# Patient Record
Sex: Male | Born: 1955 | Race: White | Hispanic: No | Marital: Single | Smoking: Current every day smoker
Health system: Southern US, Community
[De-identification: ages and names within clinical notes are randomized; demographics above are authoritative.]

## PROBLEM LIST (undated history)

## (undated) DIAGNOSIS — I10 Essential (primary) hypertension: Secondary | ICD-10-CM

## (undated) DIAGNOSIS — E785 Hyperlipidemia, unspecified: Secondary | ICD-10-CM

## (undated) DIAGNOSIS — E119 Type 2 diabetes mellitus without complications: Secondary | ICD-10-CM

## (undated) HISTORY — DX: Essential (primary) hypertension: I10

## (undated) HISTORY — DX: Hyperlipidemia, unspecified: E78.5

## (undated) HISTORY — DX: Type 2 diabetes mellitus without complications: E11.9

---

## 2013-07-29 ENCOUNTER — Emergency Department: Payer: Self-pay | Admitting: Emergency Medicine

## 2014-07-24 ENCOUNTER — Ambulatory Visit: Payer: Self-pay | Admitting: Nurse Practitioner

## 2014-08-02 LAB — HM DIABETES EYE EXAM

## 2014-09-05 ENCOUNTER — Other Ambulatory Visit: Payer: Self-pay | Admitting: Internal Medicine

## 2014-09-05 ENCOUNTER — Ambulatory Visit
Admission: RE | Admit: 2014-09-05 | Discharge: 2014-09-05 | Disposition: A | Discharge status: Home / Self Care | Payer: Self-pay | Source: Ambulatory Visit | Attending: Internal Medicine | Admitting: Internal Medicine

## 2014-09-05 DIAGNOSIS — M25552 Pain in left hip: Secondary | ICD-10-CM | POA: Insufficient documentation

## 2014-09-05 DIAGNOSIS — M25551 Pain in right hip: Secondary | ICD-10-CM

## 2014-09-26 ENCOUNTER — Other Ambulatory Visit: Payer: Self-pay

## 2014-09-27 ENCOUNTER — Ambulatory Visit: Payer: Self-pay | Admitting: Specialist

## 2014-09-29 ENCOUNTER — Other Ambulatory Visit: Payer: Self-pay | Admitting: Specialist

## 2014-10-02 ENCOUNTER — Other Ambulatory Visit: Payer: Self-pay | Admitting: Specialist

## 2014-10-02 DIAGNOSIS — M48061 Spinal stenosis, lumbar region without neurogenic claudication: Secondary | ICD-10-CM

## 2014-10-03 ENCOUNTER — Ambulatory Visit: Payer: Self-pay

## 2014-10-04 ENCOUNTER — Ambulatory Visit: Payer: Self-pay | Admitting: Internal Medicine

## 2014-10-04 ENCOUNTER — Ambulatory Visit: Payer: Self-pay | Admitting: Ophthalmology

## 2014-10-04 LAB — HM DIABETES EYE EXAM

## 2014-10-09 ENCOUNTER — Ambulatory Visit
Admission: RE | Admit: 2014-10-09 | Discharge: 2014-10-09 | Disposition: A | Discharge status: Home / Self Care | Payer: Self-pay | Source: Ambulatory Visit | Attending: Specialist | Admitting: Specialist

## 2014-10-09 DIAGNOSIS — M4806 Spinal stenosis, lumbar region: Secondary | ICD-10-CM | POA: Insufficient documentation

## 2014-10-09 DIAGNOSIS — Q7649 Other congenital malformations of spine, not associated with scoliosis: Secondary | ICD-10-CM | POA: Insufficient documentation

## 2014-10-09 DIAGNOSIS — M5386 Other specified dorsopathies, lumbar region: Secondary | ICD-10-CM | POA: Insufficient documentation

## 2014-10-09 DIAGNOSIS — M48061 Spinal stenosis, lumbar region without neurogenic claudication: Secondary | ICD-10-CM

## 2014-10-09 DIAGNOSIS — M4606 Spinal enthesopathy, lumbar region: Secondary | ICD-10-CM | POA: Insufficient documentation

## 2014-10-18 ENCOUNTER — Ambulatory Visit: Payer: Self-pay | Admitting: Specialist

## 2014-11-02 ENCOUNTER — Ambulatory Visit: Payer: Self-pay

## 2014-11-15 ENCOUNTER — Ambulatory Visit: Payer: Self-pay | Admitting: Internal Medicine

## 2014-12-13 ENCOUNTER — Ambulatory Visit: Payer: Self-pay | Admitting: Internal Medicine

## 2014-12-13 LAB — HEMOGLOBIN A1C: HEMOGLOBIN A1C: 7.1 % — AB (ref 4.0–6.0)

## 2014-12-13 LAB — BASIC METABOLIC PANEL: Glucose: 132 mg/dL

## 2014-12-16 DIAGNOSIS — E119 Type 2 diabetes mellitus without complications: Secondary | ICD-10-CM

## 2015-03-07 ENCOUNTER — Ambulatory Visit: Payer: Self-pay | Admitting: Specialist

## 2015-03-13 ENCOUNTER — Other Ambulatory Visit: Payer: Self-pay

## 2015-03-20 ENCOUNTER — Ambulatory Visit: Payer: Self-pay

## 2015-04-04 ENCOUNTER — Other Ambulatory Visit: Payer: Self-pay

## 2015-04-11 ENCOUNTER — Ambulatory Visit: Payer: Self-pay | Admitting: Internal Medicine

## 2015-04-19 ENCOUNTER — Ambulatory Visit: Payer: Self-pay

## 2015-04-19 DIAGNOSIS — E785 Hyperlipidemia, unspecified: Secondary | ICD-10-CM | POA: Insufficient documentation
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
## 2015-05-24 ENCOUNTER — Other Ambulatory Visit: Payer: Self-pay

## 2015-05-24 LAB — HEPATIC FUNCTION PANEL
ALT: 21 U/L (ref 10–40)
AST: 18 U/L (ref 14–40)
Alkaline Phosphatase: 86 U/L (ref 25–125)
BILIRUBIN DIRECT: 0.35 mg/dL (ref 0.01–0.4)
Bilirubin, Total: 1.3 mg/dL

## 2015-06-19 DIAGNOSIS — E785 Hyperlipidemia, unspecified: Secondary | ICD-10-CM

## 2015-06-28 ENCOUNTER — Ambulatory Visit: Payer: Self-pay | Admitting: Urology

## 2015-06-28 VITALS — BP 142/82 | HR 73 | Temp 98.1°F | Wt 192.0 lb

## 2015-06-28 DIAGNOSIS — J018 Other acute sinusitis: Secondary | ICD-10-CM

## 2015-06-28 MED ORDER — NAPROXEN 500 MG PO TABS: 500.0000 mg | ORAL_TABLET | Freq: Two times a day (BID) | ORAL | Status: DC

## 2015-06-28 MED ORDER — AMOXICILLIN-POT CLAVULANATE 500-125 MG PO TABS: 1.0000 | ORAL_TABLET | Freq: Three times a day (TID) | ORAL | Status: DC

## 2015-06-28 NOTE — Progress Notes (Signed)
  Patient: Jorge Olson Male    DOB: 03/14/1956   60 y.o.   MRN: [id number] Visit Date: 06/28/2015  Today's Provider: [facility]   Chief Complaint  Patient presents with  . Sinusitis  . Sore Throat   Subjective:    HPI   Patient states that last Thursday he started with muscle aches, sore throat, congestion, rhinitis, and headaches.  He believes he had a fever and had to sleep with an electric blanket to "sweat" the fever out.  He has tried everything over the counter, such as Mucinex, Sudafed, robitussin, Theraflu, etc.  No relief.  He complains of green and yellow drainage from his nose.     No Known Allergies Previous Medications   ATORVASTATIN (LIPITOR) 20 MG TABLET    Take 20 mg by mouth daily.   FLUOXETINE (PROZAC) 20 MG CAPSULE    Take 20 mg by mouth daily. Reported on 06/28/2015   GABAPENTIN (NEURONTIN) 300 MG CAPSULE    Take 300 mg by mouth at bedtime. 1 capsule QHS x  3 days then 1 capsule 2 times daily x 3 days, then 1 capsule TID for neuropathy and RLS.   INSULIN DETEMIR (LEVEMIR) 100 UNIT/ML INJECTION    Inject 15 Units into the skin 2 (two) times daily.   METFORMIN (GLUCOPHAGE) 1000 MG TABLET    Take 1,000 mg by mouth 2 (two) times daily with a meal.   NAPROXEN (NAPROSYN) 500 MG TABLET    Take 500 mg by mouth 2 (two) times daily with a meal.   SIMVASTATIN (ZOCOR) 20 MG TABLET    Take 20 mg by mouth daily.    Review of Systems  Social History  Substance Use Topics  . Smoking status: Current Every Day Smoker -- 1.00 packs/day    Types: Cigarettes  . Smokeless tobacco: Not on file     Comment: Smokes less than a pack a day.  . Alcohol Use: No   Objective:   BP 142/82 mmHg  Pulse 73  Temp(Src) 98.1 F (36.7 C) (Oral)  Wt 192 lb (87.091 kg)  SpO2 97%  Physical Exam Heart: S1, S2, without murmurs, rubs or gallops  Lung:  CTAB     Assessment & Plan:     1. Sinusitis  Augmentin 500/125, three times daily  RTC on appointment      [facility]  [facility]

## 2015-07-17 ENCOUNTER — Ambulatory Visit: Payer: Self-pay | Admitting: Physician Assistant

## 2015-07-17 VITALS — BP 129/74 | HR 88 | Temp 98.6°F | Wt 198.0 lb

## 2015-07-17 DIAGNOSIS — Z794 Long term (current) use of insulin: Principal | ICD-10-CM

## 2015-07-17 DIAGNOSIS — E119 Type 2 diabetes mellitus without complications: Secondary | ICD-10-CM

## 2015-07-17 LAB — POCT GLYCOSYLATED HEMOGLOBIN (HGB A1C): HEMOGLOBIN A1C: 8.2

## 2015-07-17 LAB — GLUCOSE, POCT (MANUAL RESULT ENTRY): POC GLUCOSE: 162 mg/dL — AB (ref 70–99)

## 2015-07-17 MED ORDER — LIRAGLUTIDE 18 MG/3ML ~~LOC~~ SOPN: PEN_INJECTOR | SUBCUTANEOUS | Status: DC

## 2015-07-17 NOTE — Progress Notes (Unsigned)
Subjective:     Patient ID: Jorge Olson, male   DOB: 03/16/1956, 60 y.o.   MRN: [id number]  HPI You were diagnosed 2003, Type II. Started insulin last July. A1c is down from 9.7 to 8.2. Goal range is 6.5.    Denies hypoglycemic. Checks blood sugar twice a day before and after work (10pm). Denies changes in diet but wants to consider raising insulin to get in goal range for A1c. Back pain prohibits exercise.   Wants to talk to endo about bilateral sharp-shooting pain in neck. Started 3 months ago, denies anything that brings it on or makes it better. Tylenol extra strength x4 arthritis every morning, and 2x at night seems to help. Counseled that this was not good. Order liver enzyme test.  Denies ever consuming alcohol.   Reported average of 150-160.   Had the "flu" couple of weeks ago. Was prescribed amoxicillin. Never vomitted, had diarrhea. Fever. Hacking cough and chest pain. More like he had PNA and was given augmentin for the PNA that caused the diarrhea.   Wants to be restarted on Zoloft. Reported 150mg  daily but having to discontinue due to loss of insurance.     Sharpshh       Review of Systems     Objective:   Physical Exam CV: normal s1/s2. RRR.Marland Kitchen. No m/r/g Pulm: Bilateral wheezing Neuro: Monofilament normal     Assessment:     ***    Plan:     ***

## 2015-07-17 NOTE — Progress Notes (Signed)
Assessment:   1. Type 2 diabetes mellitus without complication, with long-term current use of insulin (HCC)  - POCT glucose (manual entry) - POCT glycosylated hemoglobin (Hb A1C)      Plan:    1.  Rx changes: Consider adding GLP1-RA next visit. He is attempting [facility] pending this he may go there for DM care.  2.  Education: Reviewed diabetes management:  Appropriate A1C target, avoid hypoglycemia,  blood pressure (<140/80), and cholesterol (LDL <100). -   Reviewed importance of good glycemic control to reduce risk for complications.   3.   Get regular physical activity at least 30 minutes a day of moderate intensity most days of the week  4. Follow up: I recommend patient follow-up with me at 3 months.        Subjective:    Jorge Olson is a 60 y.o. male who is seen in follow up forType 2 diabetes for Group Shared Appointment. He consents and participates.  Eye exam current (within one year): here at [facility] 2016   SMBG values were reviewed with the patient.  Jorge Olson is performing SMBG on average 2 times per day.   Average over last 7 days is 150s FPg max > 250 postprandial   Reports no symptoms of hypoglycemia.   Denies foot lesions or ulcers. Does have paraesthesias and foot pain.  Denies severe hypoglycemia or admission to hospital for DKA.  Current exercise: little  The patient's history was reviewed and updated as appropriate.  No Known Allergies   Current outpatient prescriptions:  .  aspirin 81 MG tablet, Take 81 mg by mouth daily., Disp: , Rfl:  .  atorvastatin (LIPITOR) 20 MG tablet, Take 20 mg by mouth daily., Disp: , Rfl:  .  gabapentin (NEURONTIN) 300 MG capsule, Take 300 mg by mouth at bedtime. 3 capsules at night and 3 capsules in the morning, Disp: , Rfl:  .  insulin detemir (LEVEMIR) 100 UNIT/ML injection, Inject 20 Units into the skin 2 (two) times daily. , Disp: , Rfl:  .  metFORMIN (GLUCOPHAGE) 1000 MG tablet, Take  1,000 mg by mouth 2 (two) times daily with a meal., Disp: , Rfl:  .  naproxen (NAPROSYN) 500 MG tablet, Take 1 tablet (500 mg total) by mouth 2 (two) times daily with a meal., Disp: 60 tablet, Rfl: 3 .  amoxicillin-clavulanate (AUGMENTIN) 500-125 MG tablet, Take 1 tablet (500 mg total) by mouth 3 (three) times daily. (Patient not taking: Reported on 07/17/2015), Disp: 30 tablet, Rfl: 0 .  FLUoxetine (PROZAC) 20 MG capsule, Take 20 mg by mouth daily. Reported on 07/17/2015, Disp: , Rfl:  .  simvastatin (ZOCOR) 20 MG tablet, Take 20 mg by mouth daily. Reported on 07/17/2015, Disp: , Rfl:   Social History   Social History  . Marital Status: Single    Spouse Name: N/A  . Number of Children: N/A  . Years of Education: N/A   Social History Main Topics  . Smoking status: Current Every Day Smoker -- 1.00 packs/day    Types: Cigarettes  . Smokeless tobacco: Not on file     Comment: Smokes less than a pack a day.  . Alcohol Use: No  . Drug Use: Not on file  . Sexual Activity: Not on file   Other Topics Concern  . Not on file   Social History Narrative    Family History  Problem Relation Age of Onset  . Diabetes Mellitus II Mother   .  Diabetes Mellitus II Father   . Diabetes Mellitus II Sister   . Diabetes Mellitus II Sister     Review of Systems A 12 point review of systems was negative except for pertinent items noted in the HPI.   Objective:     Wt Readings from Last 3 Encounters:  07/17/15 198 lb (89.812 kg)  06/28/15 192 lb (87.091 kg)  04/19/15 181 lb (82.101 kg)   BP 129/74 mmHg  Pulse 88  Temp(Src) 98.6 F (37 C)  Wt 198 lb (89.812 kg)                                            Lab Review No components found for: A1C No results found for: GLUCOSE, POTASSIUM, CHLORIDE, CO2, BUN, CREATININE No components found for: LDL,  LDLCALC,  LDLDIRECT Lab Results  Component Value Date   GLU 132 12/13/2014     DIABETES MELLITUS RESULTS: Lab Results   Component Value Date   HGBA1C 8.2 07/17/2015   HGBA1C 7.1* 12/13/2014   No results found for: GLUF, MICROALBUR, LDLCALC, CREATININE No results found for: CHOL No results found for: LDLCALC No components found for: CHOLLLDLDIRECT No components found for: MICROALB/CR No results found for: Pilar JarvisGFRAA, [facility] HospitalGFRNONAA

## 2015-07-19 ENCOUNTER — Other Ambulatory Visit: Payer: Self-pay

## 2015-07-19 DIAGNOSIS — E7800 Pure hypercholesterolemia, unspecified: Secondary | ICD-10-CM

## 2015-07-19 DIAGNOSIS — E119 Type 2 diabetes mellitus without complications: Secondary | ICD-10-CM

## 2015-07-20 LAB — LIPID PANEL
CHOLESTEROL TOTAL: 86 mg/dL — AB (ref 100–199)
Chol/HDL Ratio: 2 ratio units (ref 0.0–5.0)
HDL: 44 mg/dL (ref >39)
Triglycerides: 226 mg/dL — ABNORMAL HIGH (ref 0–149)
VLDL CHOLESTEROL CAL: 45 mg/dL — AB (ref 5–40)

## 2015-07-20 LAB — HEPATIC FUNCTION PANEL
ALBUMIN: 4.5 g/dL (ref 3.5–5.5)
ALT: 24 IU/L (ref 0–44)
AST: 18 IU/L (ref 0–40)
Alkaline Phosphatase: 93 IU/L (ref 39–117)
Bilirubin Total: 0.9 mg/dL (ref 0.0–1.2)
Bilirubin, Direct: 0.25 mg/dL (ref 0.00–0.40)
TOTAL PROTEIN: 6.2 g/dL (ref 6.0–8.5)

## 2015-07-20 LAB — HEMOGLOBIN A1C
ESTIMATED AVERAGE GLUCOSE: 180 mg/dL
Hgb A1c MFr Bld: 7.9 % — ABNORMAL HIGH (ref 4.8–5.6)

## 2015-07-31 ENCOUNTER — Ambulatory Visit: Payer: Self-pay | Admitting: Nurse Practitioner

## 2015-07-31 VITALS — BP 142/88 | HR 67 | Wt 199.0 lb

## 2015-07-31 DIAGNOSIS — Z794 Long term (current) use of insulin: Secondary | ICD-10-CM

## 2015-07-31 DIAGNOSIS — E1142 Type 2 diabetes mellitus with diabetic polyneuropathy: Secondary | ICD-10-CM

## 2015-07-31 DIAGNOSIS — J0100 Acute maxillary sinusitis, unspecified: Secondary | ICD-10-CM

## 2015-07-31 DIAGNOSIS — I10 Essential (primary) hypertension: Secondary | ICD-10-CM

## 2015-07-31 DIAGNOSIS — J302 Other seasonal allergic rhinitis: Secondary | ICD-10-CM

## 2015-07-31 DIAGNOSIS — E1165 Type 2 diabetes mellitus with hyperglycemia: Secondary | ICD-10-CM

## 2015-07-31 DIAGNOSIS — IMO0002 Reserved for concepts with insufficient information to code with codable children: Secondary | ICD-10-CM

## 2015-07-31 DIAGNOSIS — E782 Mixed hyperlipidemia: Secondary | ICD-10-CM

## 2015-07-31 MED ORDER — FEXOFENADINE HCL 180 MG PO TABS: 180.0000 mg | ORAL_TABLET | Freq: Every day | ORAL | Status: DC

## 2015-07-31 MED ORDER — SIMVASTATIN 20 MG PO TABS: 20.0000 mg | ORAL_TABLET | Freq: Every day | ORAL | Status: DC

## 2015-07-31 MED ORDER — GABAPENTIN 300 MG PO CAPS: 300.0000 mg | ORAL_CAPSULE | Freq: Every day | ORAL | Status: DC

## 2015-07-31 MED ORDER — CEFDINIR 300 MG PO CAPS: 300.0000 mg | ORAL_CAPSULE | Freq: Two times a day (BID) | ORAL | Status: DC

## 2015-07-31 NOTE — Progress Notes (Signed)
   Subjective:    Patient ID: Jorge Olson, male    DOB: 11/01/1955, 60 y.o.   MRN: [id number]  Foot Pain Associated symptoms include congestion and coughing.  Diabetes   Pt with c/o burning of feet, interfering with sleep, is taking gabapentin 900 mg bid, med does help but is out of gabapentin.   Sleeps with electric blanket for legs and feet.     C/o allergic rhinitis, with some mucopurulent drainage.  Was recently treated for sinusitis, did not complete script, r/t diarrhea.    Reports fever x 1.       Review of Systems  HENT: Positive for congestion, postnasal drip, rhinorrhea, sinus pressure and sneezing.   Respiratory: Positive for cough.    Positive for severe burning of feet.    No other c/o     Objective:   Physical Exam  Constitutional: He appears well-developed and well-nourished.  HENT:  Head: Normocephalic.  Eyes: Pupils are equal, round, and reactive to light.  Neck: No thyromegaly present.  Cardiovascular: Normal rate, normal heart sounds and intact distal pulses.   Pulmonary/Chest: No respiratory distress. He has no wheezes.  Musculoskeletal: He exhibits no edema.  Lymphadenopathy:    He has no cervical adenopathy.  Psychiatric: He has a normal mood and affect. Judgment and thought content normal.   L foot with mildly decreased sensation, no alteration in skin integrity.    Positive for facial tenderness to percussion       Assessment & Plan:  Jorge Olson was seen today for foot pain and diabetes.  Diagnoses and all orders for this visit:  Diabetic polyneuropathy associated with type 2 diabetes mellitus (HCC)  Other seasonal allergic rhinitis  Acute maxillary sinusitis, recurrence not specified  Other orders -     gabapentin (NEURONTIN) 300 MG capsule; Take 1 capsule (300 mg total) by mouth at bedtime. 3 capsules at night and 3 capsules in the morning -     simvastatin (ZOCOR) 20 MG tablet; Take 1 tablet (20 mg total) by mouth daily. Reported  on 07/31/2015 -     fexofenadine (ALLEGRA) 180 MG tablet; Take 1 tablet (180 mg total) by mouth daily. -     cefdinir (OMNICEF) 300 MG capsule; Take 1 capsule (300 mg total) by mouth 2 (two) times daily.  will order labs to be drawn in 3 months, with follow up scheduled after lab results are available

## 2015-08-30 ENCOUNTER — Other Ambulatory Visit: Payer: Self-pay | Admitting: Internal Medicine

## 2015-09-05 ENCOUNTER — Encounter: Payer: Self-pay | Admitting: Nurse Practitioner

## 2015-09-11 ENCOUNTER — Ambulatory Visit: Payer: Self-pay

## 2015-09-11 DIAGNOSIS — E782 Mixed hyperlipidemia: Secondary | ICD-10-CM

## 2015-09-11 DIAGNOSIS — I10 Essential (primary) hypertension: Secondary | ICD-10-CM

## 2015-09-11 DIAGNOSIS — E1142 Type 2 diabetes mellitus with diabetic polyneuropathy: Secondary | ICD-10-CM

## 2015-09-11 DIAGNOSIS — E1165 Type 2 diabetes mellitus with hyperglycemia: Secondary | ICD-10-CM

## 2015-09-11 DIAGNOSIS — Z794 Long term (current) use of insulin: Secondary | ICD-10-CM

## 2015-09-11 DIAGNOSIS — IMO0002 Reserved for concepts with insufficient information to code with codable children: Secondary | ICD-10-CM

## 2015-09-12 LAB — COMPREHENSIVE METABOLIC PANEL
A/G RATIO: 1 — AB (ref 1.2–2.2)
ALT: 21 IU/L (ref 0–44)
AST: 18 IU/L (ref 0–40)
Albumin: 3.9 g/dL (ref 3.5–5.5)
Alkaline Phosphatase: 99 IU/L (ref 39–117)
BUN/Creatinine Ratio: 17 (ref 9–20)
BUN: 11 mg/dL (ref 6–24)
Bilirubin Total: 0.3 mg/dL (ref 0.0–1.2)
CALCIUM: 9.4 mg/dL (ref 8.7–10.2)
CO2: 23 mmol/L (ref 18–29)
Chloride: 99 mmol/L (ref 96–106)
Creatinine, Ser: 0.65 mg/dL — ABNORMAL LOW (ref 0.76–1.27)
GFR, EST AFRICAN AMERICAN: 125 mL/min/{1.73_m2} (ref >59)
GFR, EST NON AFRICAN AMERICAN: 108 mL/min/{1.73_m2} (ref >59)
Globulin, Total: 3.8 g/dL (ref 1.5–4.5)
Glucose: 92 mg/dL (ref 65–99)
POTASSIUM: 4.1 mmol/L (ref 3.5–5.2)
Sodium: 139 mmol/L (ref 134–144)
TOTAL PROTEIN: 7.7 g/dL (ref 6.0–8.5)

## 2015-09-12 LAB — URINALYSIS
Bilirubin, UA: NEGATIVE
Ketones, UA: NEGATIVE
Leukocytes, UA: NEGATIVE
NITRITE UA: NEGATIVE
Protein, UA: NEGATIVE
RBC, UA: NEGATIVE
Specific Gravity, UA: >=1.03 — AB (ref 1.005–1.030)
UUROB: 0.2 mg/dL (ref 0.2–1.0)
pH, UA: 5.5 (ref 5.0–7.5)

## 2015-09-17 ENCOUNTER — Telehealth: Payer: Self-pay | Admitting: Urology

## 2015-09-17 NOTE — Telephone Encounter (Signed)
Patient will need an urine microalbumin w/creat ratio in the future.

## 2015-09-18 ENCOUNTER — Other Ambulatory Visit: Payer: Self-pay

## 2015-09-20 ENCOUNTER — Other Ambulatory Visit: Payer: Self-pay

## 2015-09-20 DIAGNOSIS — E119 Type 2 diabetes mellitus without complications: Secondary | ICD-10-CM

## 2015-09-20 DIAGNOSIS — Z794 Long term (current) use of insulin: Principal | ICD-10-CM

## 2015-09-21 LAB — MICROALBUMIN / CREATININE URINE RATIO
CREATININE, UR: 206.6 mg/dL
MICROALB/CREAT RATIO: 5.9 mg/g creat (ref 0.0–30.0)
Microalbumin, Urine: 12.2 ug/mL

## 2015-09-21 LAB — LIPID PANEL
CHOL/HDL RATIO: 1.6 ratio (ref 0.0–5.0)
Cholesterol, Total: 69 mg/dL — ABNORMAL LOW (ref 100–199)
HDL: 44 mg/dL (ref >39)
Triglycerides: 128 mg/dL (ref 0–149)
VLDL Cholesterol Cal: 26 mg/dL (ref 5–40)

## 2015-10-16 ENCOUNTER — Ambulatory Visit: Payer: Self-pay | Admitting: Physician Assistant

## 2015-10-16 DIAGNOSIS — E1142 Type 2 diabetes mellitus with diabetic polyneuropathy: Secondary | ICD-10-CM

## 2015-10-16 DIAGNOSIS — E785 Hyperlipidemia, unspecified: Secondary | ICD-10-CM

## 2015-10-16 DIAGNOSIS — Z794 Long term (current) use of insulin: Principal | ICD-10-CM

## 2015-10-16 MED ORDER — GABAPENTIN 300 MG PO CAPS: 300.0000 mg | ORAL_CAPSULE | Freq: Every day | ORAL | Status: DC

## 2015-10-16 MED ORDER — SIMVASTATIN 20 MG PO TABS: 20.0000 mg | ORAL_TABLET | Freq: Every day | ORAL | Status: DC

## 2015-10-16 MED ORDER — NAPROXEN 500 MG PO TABS: 500.0000 mg | ORAL_TABLET | Freq: Two times a day (BID) | ORAL | Status: DC

## 2015-10-16 MED ORDER — METFORMIN HCL 1000 MG PO TABS
ORAL_TABLET | ORAL | Status: DC
Start: 2015-10-16 — End: 2016-01-24

## 2015-10-16 MED ORDER — FEXOFENADINE HCL 180 MG PO TABS: 180.0000 mg | ORAL_TABLET | Freq: Every day | ORAL | Status: DC

## 2015-10-16 NOTE — Progress Notes (Signed)
Assessment:   1. Type 2 diabetes mellitus with diabetic polyneuropathy, with long-term current use of insulin (HCC)  - gabapentin (NEURONTIN) 300 MG capsule; Take 1 capsule (300 mg total) by mouth at bedtime. 3 capsules at night and 3 capsules in the morning  Dispense: 630 capsule; Refill: 1 - metFORMIN (GLUCOPHAGE) 1000 MG tablet; TAKE 1 TABLET BY MOUTH 2 TIMES A DAY.  Dispense: 180 tablet; Refill: PRN - naproxen (NAPROSYN) 500 MG tablet; Take 1 tablet (500 mg total) by mouth 2 (two) times daily with a meal.  Dispense: 60 tablet; Refill: 3 - fexofenadine (ALLEGRA) 180 MG tablet; Take 1 tablet (180 mg total) by mouth daily.  Dispense: 90 tablet; Refill: 1 - simvastatin (ZOCOR) 20 MG tablet; Take 1 tablet (20 mg total) by mouth daily. Reported on 07/31/2015  Dispense: 90 tablet; Refill: 1  2. Hyperlipidemia  Jorge Olson attended the shared medical appointment  Topics that were discussed tonight include diet, exercise, taking medications, blood glucose pattern recognition, meaning of laboratory results.        Plan:    1.  Rx changes: none  2.  Education: Reviewed diabetes management:  Appropriate A1C target, avoid hypoglycemia,  blood pressure (<140/80), and cholesterol (LDL <100). -   Reviewed importance of good glycemic control to reduce risk for complications.   3.   Get regular physical activity at least 30 minutes a day of moderate intensity most days of the week  This discussion took at least 15 minutes out of a total visit time of 25 minutes today.  4. Follow up: I recommend patient follow-up with me at 3 months.   5. Referrals: none     Subjective:    Jorge Olson is a 60 y.o. male who is seen in follow up forType 2 diabetes from 07/17/2015   Jorge Olson is performing SMBG on average 2 times per day.   Average over last 7 days is 151  Reports no symptoms of hypoglycemia.   The patient's history was reviewed and updated as appropriate.  No Known  Allergies   Current outpatient prescriptions:  .  acetaminophen (TYLENOL) 650 MG CR tablet, Take 650 mg by mouth as needed for pain (3 in the morning and 2 in the evening)., Disp: , Rfl:  .  aspirin 81 MG tablet, Take 81 mg by mouth daily., Disp: , Rfl:  .  fexofenadine (ALLEGRA) 180 MG tablet, Take 1 tablet (180 mg total) by mouth daily., Disp: 90 tablet, Rfl: 1 .  gabapentin (NEURONTIN) 300 MG capsule, Take 1 capsule (300 mg total) by mouth at bedtime. 3 capsules at night and 3 capsules in the morning, Disp: 630 capsule, Rfl: 1 .  insulin detemir (LEVEMIR) 100 UNIT/ML injection, Inject 16 Units into the skin at bedtime. , Disp: , Rfl:  .  Liraglutide 18 MG/3ML SOPN, 0.6 mg sq qd x 1 week then 1.2 mg  Sq qd (Patient taking differently: 1.8  mg sq qd x 1 week then 1.2 mg  Sq qd), Disp: 6 mL, Rfl: 3 .  metFORMIN (GLUCOPHAGE) 1000 MG tablet, TAKE 1 TABLET BY MOUTH 2 TIMES A DAY., Disp: 180 tablet, Rfl: PRN .  naproxen (NAPROSYN) 500 MG tablet, Take 1 tablet (500 mg total) by mouth 2 (two) times daily with a meal., Disp: 60 tablet, Rfl: 3 .  simvastatin (ZOCOR) 20 MG tablet, Take 1 tablet (20 mg total) by mouth daily. Reported on 07/31/2015, Disp: 90 tablet, Rfl: 1 .  FLUoxetine (PROZAC)  20 MG capsule, Take 20 mg by mouth daily. Reported on 10/16/2015, Disp: , Rfl:   Social History   Social History  . Marital Status: Single    Spouse Name: N/A  . Number of Children: N/A  . Years of Education: N/A   Social History Main Topics  . Smoking status: Current Every Day Smoker -- 1.00 packs/day    Types: Cigarettes  . Smokeless tobacco: Not on file     Comment: Smokes less than a pack a day.  . Alcohol Use: No  . Drug Use: No  . Sexual Activity: Not on file   Other Topics Concern  . Not on file   Social History Narrative    Family History  Problem Relation Age of Onset  . Diabetes Mellitus II Mother   . Diabetes Mellitus II Father   . Diabetes Mellitus II Sister   . Diabetes Mellitus II  Sister     Review of Systems A 12 point review of systems was negative except for pertinent items noted in the HPI.   Objective:     Wt Readings from Last 3 Encounters:  07/31/15 199 lb (90.266 kg)  07/17/15 198 lb (89.812 kg)  06/28/15 192 lb (87.091 kg)   There were no vitals taken for this visit.                                            Lab Review No components found for: A1C GLUCOSE (mg/dL)  Date Value  04/54/098105/16/2017 92   CO2 (mmol/L)  Date Value  09/11/2015 23   BUN (mg/dL)  Date Value  19/14/782905/16/2017 11   CREATININE, SER (mg/dL)  Date Value  56/21/308605/16/2017 0.65*   No components found for: LDL,  LDLCALC,  LDLDIRECT Lab Results  Component Value Date   NA 139 09/11/2015   K 4.1 09/11/2015   CL 99 09/11/2015   CO2 23 09/11/2015   BUN 11 09/11/2015   CREATININE 0.65* 09/11/2015   GFRAA 125 09/11/2015   GFRNONAA 108 09/11/2015   GLU 132 12/13/2014   CALCIUM 9.4 09/11/2015   ALBUMIN 3.9 09/11/2015     DIABETES MELLITUS RESULTS: Lab Results  Component Value Date   HGBA1C 7.9* 07/19/2015   HGBA1C 8.2 07/17/2015   HGBA1C 7.1* 12/13/2014   Lab Results  Component Value Date   LDLCALC Comment* 09/20/2015   CREATININE 0.65* 09/11/2015   Lab Results  Component Value Date   CHOL 69* 09/20/2015   Lab Results  Component Value Date   LDLCALC Comment* 09/20/2015   No components found for: CHOLLLDLDIRECT No components found for: MICROALB/CR Lab Results  Component Value Date   GFRAA 125 09/11/2015   GFRNONAA 108 09/11/2015

## 2015-11-06 ENCOUNTER — Other Ambulatory Visit: Payer: Self-pay

## 2015-11-06 DIAGNOSIS — Z794 Long term (current) use of insulin: Secondary | ICD-10-CM

## 2015-11-06 DIAGNOSIS — E1142 Type 2 diabetes mellitus with diabetic polyneuropathy: Secondary | ICD-10-CM

## 2015-11-06 DIAGNOSIS — E785 Hyperlipidemia, unspecified: Secondary | ICD-10-CM

## 2015-11-07 LAB — MICROALBUMIN / CREATININE URINE RATIO
Creatinine, Urine: 248.8 mg/dL
MICROALB/CREAT RATIO: 7.4 mg/g{creat} (ref 0.0–30.0)
MICROALBUM., U, RANDOM: 18.5 ug/mL

## 2015-11-07 LAB — LIPID PANEL
CHOL/HDL RATIO: 1.9 ratio (ref 0.0–5.0)
CHOLESTEROL TOTAL: 86 mg/dL — AB (ref 100–199)
HDL: 45 mg/dL (ref >39)
TRIGLYCERIDES: 214 mg/dL — AB (ref 0–149)
VLDL Cholesterol Cal: 43 mg/dL — ABNORMAL HIGH (ref 5–40)

## 2015-11-07 LAB — HEMOGLOBIN A1C
Est. average glucose Bld gHb Est-mCnc: 157 mg/dL
HEMOGLOBIN A1C: 7.1 % — AB (ref 4.8–5.6)

## 2016-01-09 ENCOUNTER — Other Ambulatory Visit: Payer: Self-pay | Admitting: Internal Medicine

## 2016-01-09 DIAGNOSIS — E1142 Type 2 diabetes mellitus with diabetic polyneuropathy: Secondary | ICD-10-CM

## 2016-01-09 DIAGNOSIS — Z794 Long term (current) use of insulin: Principal | ICD-10-CM

## 2016-01-10 ENCOUNTER — Other Ambulatory Visit: Payer: Self-pay

## 2016-01-10 DIAGNOSIS — Z794 Long term (current) use of insulin: Principal | ICD-10-CM

## 2016-01-10 DIAGNOSIS — E1142 Type 2 diabetes mellitus with diabetic polyneuropathy: Secondary | ICD-10-CM

## 2016-01-10 MED ORDER — GABAPENTIN 300 MG PO CAPS: 300.0000 mg | ORAL_CAPSULE | Freq: Three times a day (TID) | ORAL | 0 refills | Status: DC

## 2016-01-24 ENCOUNTER — Ambulatory Visit: Payer: Self-pay | Admitting: Nurse Practitioner

## 2016-01-24 VITALS — BP 125/75 | HR 71 | Ht 72.0 in | Wt 194.0 lb

## 2016-01-24 DIAGNOSIS — E119 Type 2 diabetes mellitus without complications: Secondary | ICD-10-CM

## 2016-01-24 DIAGNOSIS — E782 Mixed hyperlipidemia: Secondary | ICD-10-CM

## 2016-01-24 DIAGNOSIS — E1142 Type 2 diabetes mellitus with diabetic polyneuropathy: Secondary | ICD-10-CM

## 2016-01-24 DIAGNOSIS — Z794 Long term (current) use of insulin: Secondary | ICD-10-CM

## 2016-01-24 MED ORDER — GABAPENTIN 300 MG PO CAPS: 300.0000 mg | ORAL_CAPSULE | Freq: Three times a day (TID) | ORAL | 0 refills | Status: DC

## 2016-01-24 MED ORDER — METFORMIN HCL 1000 MG PO TABS: ORAL_TABLET | ORAL | 99 refills | Status: DC

## 2016-01-24 MED ORDER — INSULIN DETEMIR 100 UNIT/ML ~~LOC~~ SOLN: 16.0000 [IU] | Freq: Every day | SUBCUTANEOUS | 2 refills | Status: DC

## 2016-01-24 MED ORDER — NORTRIPTYLINE HCL 25 MG PO CAPS: ORAL_CAPSULE | ORAL | 2 refills | Status: DC

## 2016-01-24 MED ORDER — FEXOFENADINE HCL 180 MG PO TABS: 180.0000 mg | ORAL_TABLET | Freq: Every day | ORAL | 1 refills | Status: DC

## 2016-01-24 MED ORDER — NAPROXEN 500 MG PO TABS: 500.0000 mg | ORAL_TABLET | Freq: Two times a day (BID) | ORAL | 3 refills | Status: DC

## 2016-01-24 MED ORDER — SIMVASTATIN 20 MG PO TABS: 20.0000 mg | ORAL_TABLET | Freq: Every day | ORAL | 1 refills | Status: DC

## 2016-01-24 MED ORDER — LIRAGLUTIDE 18 MG/3ML ~~LOC~~ SOPN: 1.8000 mg | PEN_INJECTOR | Freq: Every morning | SUBCUTANEOUS | 11 refills | Status: DC

## 2016-01-24 NOTE — Patient Instructions (Signed)
See written instructions

## 2016-01-24 NOTE — Progress Notes (Signed)
Is requesting starting on zoloft, which is not on our formulary States he has an anger issue Is not taking prozac    Exam:   Alert verbally approp No adenopathy, no thyromegaly, carotid bruit not present BBrS mildly diminished, otherwise unremarkable AP RRR No BLE edema Palpable PT pulses bilaterally  Impression:    Anger, will start nortriptyline 25 mg at hs, x 2 weeks, then 2 at hs  Will have labs drawn in late oct, then see pt after labs to review.  Will also assess his response to nortriptlyine.

## 2016-02-21 ENCOUNTER — Other Ambulatory Visit: Payer: Self-pay

## 2016-02-21 DIAGNOSIS — Z794 Long term (current) use of insulin: Secondary | ICD-10-CM

## 2016-02-21 DIAGNOSIS — E119 Type 2 diabetes mellitus without complications: Secondary | ICD-10-CM

## 2016-02-21 DIAGNOSIS — E782 Mixed hyperlipidemia: Secondary | ICD-10-CM

## 2016-02-21 DIAGNOSIS — Z23 Encounter for immunization: Secondary | ICD-10-CM

## 2016-02-22 LAB — HEMOGLOBIN A1C
ESTIMATED AVERAGE GLUCOSE: 151 mg/dL
HEMOGLOBIN A1C: 6.9 % — AB (ref 4.8–5.6)

## 2016-02-22 LAB — COMPREHENSIVE METABOLIC PANEL
A/G RATIO: 2.8 — AB (ref 1.2–2.2)
ALBUMIN: 5.1 g/dL (ref 3.5–5.5)
ALT: 41 IU/L (ref 0–44)
AST: 30 IU/L (ref 0–40)
Alkaline Phosphatase: 95 IU/L (ref 39–117)
BILIRUBIN TOTAL: 1.1 mg/dL (ref 0.0–1.2)
BUN / CREAT RATIO: 20 (ref 9–20)
BUN: 27 mg/dL — ABNORMAL HIGH (ref 6–24)
CHLORIDE: 99 mmol/L (ref 96–106)
CO2: 26 mmol/L (ref 18–29)
Calcium: 10.5 mg/dL — ABNORMAL HIGH (ref 8.7–10.2)
Creatinine, Ser: 1.33 mg/dL — ABNORMAL HIGH (ref 0.76–1.27)
GFR calc non Af Amer: 59 mL/min/{1.73_m2} — ABNORMAL LOW (ref >59)
GFR, EST AFRICAN AMERICAN: 68 mL/min/{1.73_m2} (ref >59)
GLOBULIN, TOTAL: 1.8 g/dL (ref 1.5–4.5)
Glucose: 122 mg/dL — ABNORMAL HIGH (ref 65–99)
POTASSIUM: 5.1 mmol/L (ref 3.5–5.2)
SODIUM: 145 mmol/L — AB (ref 134–144)
TOTAL PROTEIN: 6.9 g/dL (ref 6.0–8.5)

## 2016-02-22 LAB — LIPID PANEL
CHOL/HDL RATIO: 1.9 ratio (ref 0.0–5.0)
Cholesterol, Total: 93 mg/dL — ABNORMAL LOW (ref 100–199)
HDL: 49 mg/dL (ref >39)
LDL Calculated: 0 mg/dL (ref 0–99)
TRIGLYCERIDES: 219 mg/dL — AB (ref 0–149)
VLDL Cholesterol Cal: 44 mg/dL — ABNORMAL HIGH (ref 5–40)

## 2016-02-28 ENCOUNTER — Ambulatory Visit: Payer: Self-pay | Admitting: Nurse Practitioner

## 2016-02-28 VITALS — BP 143/79 | HR 100 | Ht 72.0 in | Wt 195.0 lb

## 2016-02-28 DIAGNOSIS — R7989 Other specified abnormal findings of blood chemistry: Secondary | ICD-10-CM

## 2016-02-28 DIAGNOSIS — E1140 Type 2 diabetes mellitus with diabetic neuropathy, unspecified: Secondary | ICD-10-CM

## 2016-02-28 DIAGNOSIS — Z794 Long term (current) use of insulin: Principal | ICD-10-CM

## 2016-02-28 DIAGNOSIS — E1142 Type 2 diabetes mellitus with diabetic polyneuropathy: Secondary | ICD-10-CM

## 2016-02-28 LAB — GLUCOSE, POCT (MANUAL RESULT ENTRY): POC GLUCOSE: 107 mg/dL — AB (ref 70–99)

## 2016-02-28 MED ORDER — GABAPENTIN 300 MG PO CAPS: ORAL_CAPSULE | ORAL | 5 refills | Status: DC

## 2016-02-28 NOTE — Progress Notes (Signed)
HERE TONIGHT TO DETERMINE HIS a1c, IS AT BEST OF 6.9  HAVE DISCUSSED WITH PT THAT GOING ANY LOWER PUTS HIM AT A MUCH GREATER RISK OF LOW BLOOD SUGARS THAT CAN BE DEADLY  WILL NOT CHANGE ANY MEDS  PT STATES HIS GABAPENTIN SHOULD BE 3 CAPSULES AM AND P.M  OR 6 CAPSULES PER DAY.  CHANGES MADE IN ORDERS  CREATININE IS ELEVATED WILL RECHECK RENAL PANEL WHEN POSSIBLE

## 2016-03-06 ENCOUNTER — Other Ambulatory Visit: Payer: Self-pay | Admitting: Internal Medicine

## 2016-03-06 ENCOUNTER — Other Ambulatory Visit: Payer: Self-pay

## 2016-03-06 DIAGNOSIS — Z794 Long term (current) use of insulin: Principal | ICD-10-CM

## 2016-03-06 DIAGNOSIS — E1142 Type 2 diabetes mellitus with diabetic polyneuropathy: Secondary | ICD-10-CM

## 2016-03-07 ENCOUNTER — Other Ambulatory Visit: Payer: Self-pay

## 2016-03-07 DIAGNOSIS — Z794 Long term (current) use of insulin: Secondary | ICD-10-CM

## 2016-03-07 DIAGNOSIS — E1142 Type 2 diabetes mellitus with diabetic polyneuropathy: Secondary | ICD-10-CM

## 2016-03-07 LAB — RENAL FUNCTION PANEL
Albumin: 4.8 g/dL (ref 3.5–5.5)
BUN/Creatinine Ratio: 14 (ref 9–20)
BUN: 18 mg/dL (ref 6–24)
CO2: 28 mmol/L (ref 18–29)
Calcium: 9.9 mg/dL (ref 8.7–10.2)
Chloride: 102 mmol/L (ref 96–106)
Creatinine, Ser: 1.32 mg/dL — ABNORMAL HIGH (ref 0.76–1.27)
GFR calc Af Amer: 68 mL/min/{1.73_m2} (ref >59)
GFR calc non Af Amer: 59 mL/min/{1.73_m2} — ABNORMAL LOW (ref >59)
Glucose: 139 mg/dL — ABNORMAL HIGH (ref 65–99)
Phosphorus: 4.2 mg/dL (ref 2.5–4.5)
Potassium: 4.3 mmol/L (ref 3.5–5.2)
Sodium: 146 mmol/L — ABNORMAL HIGH (ref 134–144)

## 2016-03-07 MED ORDER — NAPROXEN 500 MG PO TABS: 500.0000 mg | ORAL_TABLET | Freq: Two times a day (BID) | ORAL | 3 refills | Status: DC

## 2016-03-07 NOTE — Telephone Encounter (Signed)
RX sent back in sept but was set to print resending it electronically.

## 2016-03-12 ENCOUNTER — Telehealth: Payer: Self-pay

## 2016-03-12 NOTE — Telephone Encounter (Signed)
-----   Message from Jacelyn Pieah Doles-Johnson, NP sent at 03/11/2016  6:10 PM EST ----- Labs stable compared to previous.

## 2016-03-12 NOTE — Telephone Encounter (Signed)
Called pt and gave results from doctor. PT verbalized understanding.

## 2016-05-06 ENCOUNTER — Other Ambulatory Visit: Payer: Self-pay | Admitting: Nurse Practitioner

## 2016-05-29 ENCOUNTER — Ambulatory Visit: Payer: Self-pay | Admitting: Nurse Practitioner

## 2016-05-29 DIAGNOSIS — E118 Type 2 diabetes mellitus with unspecified complications: Secondary | ICD-10-CM

## 2016-05-29 DIAGNOSIS — E782 Mixed hyperlipidemia: Secondary | ICD-10-CM

## 2016-05-29 DIAGNOSIS — Z794 Long term (current) use of insulin: Secondary | ICD-10-CM

## 2016-05-29 DIAGNOSIS — E1140 Type 2 diabetes mellitus with diabetic neuropathy, unspecified: Secondary | ICD-10-CM

## 2016-05-29 DIAGNOSIS — R5382 Chronic fatigue, unspecified: Secondary | ICD-10-CM

## 2016-05-29 DIAGNOSIS — R110 Nausea: Secondary | ICD-10-CM

## 2016-05-29 MED ORDER — PREDNISONE 5 MG PO TABS: ORAL_TABLET | ORAL | 0 refills | Status: AC

## 2016-05-29 MED ORDER — ONDANSETRON 4 MG PO TBDP: 4.0000 mg | ORAL_TABLET | Freq: Three times a day (TID) | ORAL | Status: AC | PRN

## 2016-05-29 MED ORDER — OMEPRAZOLE 20 MG PO CPDR: 20.0000 mg | DELAYED_RELEASE_CAPSULE | Freq: Every day | ORAL | 2 refills | Status: DC

## 2016-05-29 NOTE — Progress Notes (Signed)
HPI:  On victoza in a.m. And levemir in the evening.    In October a1c at 6.9 with average daily blood sugar of 144.     Has severe sciatica, involving the L side, noted to be walking hunched over    Exam: Walking hunched over in pain.    Rest of exam deferred   Plan:    Severe sciatica, will do a 40 mg max taper, for 24 days, diabetes is under excellent control will add omeprazole for stomach protection Pt will carefully monitor sugars and intake while on the steroids  Elevated creatinine, will do full labs, if stable will continue to monitor, if elevated will consider referral.    Will do full diabetes labs   Will make an eye referral

## 2016-05-30 LAB — CBC WITH DIFFERENTIAL/PLATELET
BASOS ABS: 0 10*3/uL (ref 0.0–0.2)
Basos: 0 %
EOS (ABSOLUTE): 0.3 10*3/uL (ref 0.0–0.4)
Eos: 3 %
HEMOGLOBIN: 14.9 g/dL (ref 13.0–17.7)
Hematocrit: 44.7 % (ref 37.5–51.0)
Immature Grans (Abs): 0 10*3/uL (ref 0.0–0.1)
Immature Granulocytes: 0 %
LYMPHS ABS: 3.3 10*3/uL — AB (ref 0.7–3.1)
Lymphs: 36 %
MCH: 32.3 pg (ref 26.6–33.0)
MCHC: 33.3 g/dL (ref 31.5–35.7)
MCV: 97 fL (ref 79–97)
MONOCYTES: 6 %
MONOS ABS: 0.6 10*3/uL (ref 0.1–0.9)
NEUTROS ABS: 4.9 10*3/uL (ref 1.4–7.0)
Neutrophils: 55 %
Platelets: 195 10*3/uL (ref 150–379)
RBC: 4.62 x10E6/uL (ref 4.14–5.80)
RDW: 13.6 % (ref 12.3–15.4)
WBC: 9 10*3/uL (ref 3.4–10.8)

## 2016-05-30 LAB — COMPREHENSIVE METABOLIC PANEL
A/G RATIO: 2.7 — AB (ref 1.2–2.2)
ALBUMIN: 4.9 g/dL (ref 3.5–5.5)
ALT: 28 IU/L (ref 0–44)
AST: 22 IU/L (ref 0–40)
Alkaline Phosphatase: 76 IU/L (ref 39–117)
BUN / CREAT RATIO: 13 (ref 9–20)
BUN: 17 mg/dL (ref 6–24)
Bilirubin Total: 0.8 mg/dL (ref 0.0–1.2)
CALCIUM: 9.6 mg/dL (ref 8.7–10.2)
CO2: 24 mmol/L (ref 18–29)
CREATININE: 1.31 mg/dL — AB (ref 0.76–1.27)
Chloride: 102 mmol/L (ref 96–106)
GFR calc Af Amer: 69 mL/min/{1.73_m2} (ref >59)
GFR, EST NON AFRICAN AMERICAN: 60 mL/min/{1.73_m2} (ref >59)
GLOBULIN, TOTAL: 1.8 g/dL (ref 1.5–4.5)
Glucose: 107 mg/dL — ABNORMAL HIGH (ref 65–99)
Potassium: 5.1 mmol/L (ref 3.5–5.2)
SODIUM: 145 mmol/L — AB (ref 134–144)
Total Protein: 6.7 g/dL (ref 6.0–8.5)

## 2016-05-30 LAB — LIPID PANEL
CHOL/HDL RATIO: 1.5 ratio (ref 0.0–5.0)
Cholesterol, Total: 88 mg/dL — ABNORMAL LOW (ref 100–199)
HDL: 57 mg/dL (ref >39)
LDL CALC: 12 mg/dL (ref 0–99)
Triglycerides: 97 mg/dL (ref 0–149)
VLDL CHOLESTEROL CAL: 19 mg/dL (ref 5–40)

## 2016-05-30 LAB — HEMOGLOBIN A1C
Est. average glucose Bld gHb Est-mCnc: 154 mg/dL
Hgb A1c MFr Bld: 7 % — ABNORMAL HIGH (ref 4.8–5.6)

## 2016-05-30 LAB — MICROALBUMIN / CREATININE URINE RATIO
CREATININE, UR: 120.7 mg/dL
Microalb/Creat Ratio: 10.7 mg/g creat (ref 0.0–30.0)
Microalbumin, Urine: 12.9 ug/mL

## 2016-05-30 LAB — TSH: TSH: 1.57 u[IU]/mL (ref 0.450–4.500)

## 2016-06-05 ENCOUNTER — Other Ambulatory Visit: Payer: Self-pay

## 2016-06-05 ENCOUNTER — Ambulatory Visit: Payer: Self-pay | Admitting: Adult Health Nurse Practitioner

## 2016-06-05 VITALS — BP 152/89 | HR 84

## 2016-06-05 DIAGNOSIS — M4800 Spinal stenosis, site unspecified: Secondary | ICD-10-CM | POA: Insufficient documentation

## 2016-06-05 DIAGNOSIS — Z794 Long term (current) use of insulin: Secondary | ICD-10-CM

## 2016-06-05 DIAGNOSIS — E785 Hyperlipidemia, unspecified: Secondary | ICD-10-CM

## 2016-06-05 DIAGNOSIS — E1142 Type 2 diabetes mellitus with diabetic polyneuropathy: Secondary | ICD-10-CM

## 2016-06-05 MED ORDER — SIMVASTATIN 10 MG PO TABS: 10.0000 mg | ORAL_TABLET | Freq: Every day | ORAL | 3 refills | Status: DC

## 2016-06-05 NOTE — Progress Notes (Signed)
Patient: Jorge Olson Male    DOB: 12/06/1955   61 y.o.   MRN: [id number] Visit Date: 06/05/2016  Today's Provider: [facility]   Chief Complaint  Patient presents with  . Back Pain   Subjective:    HPI     DM:  Stable with A1C of 7.  Taking medications as directed.  Kidney function stable- Crt: 1.31  Sciatica:  Currently on Prednisone taper on day 2/8.  Pt states that he fell in 2012- known to have severe spinal stenosis with sciatica and leg numbness.  Pain radiates down the backs of his legs.   HLD:  LDL-12 on labs.  Taking medications as directed.     No Known Allergies Previous Medications   ACETAMINOPHEN (TYLENOL) 650 MG CR TABLET    Take 650 mg by mouth as needed for pain (3 in the morning and 2 in the evening).   ASPIRIN 81 MG TABLET    Take 81 mg by mouth daily.   FEXOFENADINE (ALLEGRA) 180 MG TABLET    Take 1 tablet (180 mg total) by mouth daily.   GABAPENTIN (NEURONTIN) 300 MG CAPSULE    BY MOUTH, 3 CAPSULES IN THE MORNING AND 3 CAPSULES AT NIGHT   INSULIN DETEMIR (LEVEMIR) 100 UNIT/ML INJECTION    Inject 0.16 mLs (16 Units total) into the skin at bedtime.   LIRAGLUTIDE 18 MG/3ML SOPN    Inject 0.3 mLs (1.8 mg total) into the skin every morning.   METFORMIN (GLUCOPHAGE) 1000 MG TABLET    TAKE 1 TABLET BY MOUTH 2 TIMES A DAY.   NAPROXEN (NAPROSYN) 500 MG TABLET    Take 1 tablet (500 mg total) by mouth 2 (two) times daily with a meal.   NORTRIPTYLINE (PAMELOR) 25 MG CAPSULE    One capsule by mouth at bedtime x 2 weeks, then increase to 2 capsules at bedtime   OMEPRAZOLE (PRILOSEC) 20 MG CAPSULE    Take 1 capsule (20 mg total) by mouth daily.   PREDNISONE (DELTASONE) 5 MG TABLET    By mouth, begin with 8 tabs, each 3rd day decrease by one tab, 8-8-8-7-7-7-6-6-6-5-5-5-4-4-4-3-3-3-2-2-2-1-1-1   SIMVASTATIN (ZOCOR) 20 MG TABLET    Take 1 tablet (20 mg total) by mouth daily. Reported on 07/31/2015    Review of Systems  All other systems reviewed and are  negative.   Social History  Substance Use Topics  . Smoking status: Current Every Day Smoker    Packs/day: 1.00    Types: Cigarettes  . Smokeless tobacco: Never Used     Comment: Smokes less than a pack a day.  . Alcohol use No   Objective:   BP (!) 152/89   Pulse 84   Physical Exam  Constitutional: He is oriented to person, place, and time. He appears well-developed and well-nourished.  HENT:  Head: Normocephalic and atraumatic.  Cardiovascular: Normal rate, regular rhythm and normal heart sounds.   Pulmonary/Chest: Effort normal and breath sounds normal.  Musculoskeletal:       Lumbar back: He exhibits decreased range of motion and pain. He exhibits no tenderness.  Neurological: He is alert and oriented to person, place, and time.  Vitals reviewed.       Assessment & Plan:           HLD:  Controlled.  Decrease Zocor to 10mg  daily.  Encourage low cholesterol, low fat diet and exercise.   DM:  Controlled.   Encourage diabetic diet and exercise.  Continue current medication  regimen.   Sciatica:  Continue prednisone taper.  Refer to chiropractor clinic.   BP elevated ? Due to pain- repeat at next visit.  Also pt on sudafed for nasal congestion.       [facility]   [facility]

## 2016-06-05 NOTE — Telephone Encounter (Signed)
Received PAP application from [facility] for Levemir placed for provider to sign. 

## 2016-06-11 ENCOUNTER — Encounter: Payer: Self-pay | Admitting: Chiropractor

## 2016-06-11 ENCOUNTER — Ambulatory Visit: Payer: Self-pay | Admitting: Chiropractor

## 2016-06-11 DIAGNOSIS — M9903 Segmental and somatic dysfunction of lumbar region: Secondary | ICD-10-CM | POA: Insufficient documentation

## 2016-06-11 DIAGNOSIS — M9902 Segmental and somatic dysfunction of thoracic region: Secondary | ICD-10-CM

## 2016-06-11 DIAGNOSIS — M62830 Muscle spasm of back: Secondary | ICD-10-CM | POA: Insufficient documentation

## 2016-06-11 DIAGNOSIS — M5432 Sciatica, left side: Secondary | ICD-10-CM

## 2016-06-11 NOTE — Progress Notes (Signed)
S:  Patient presents with left LBP with radianting numbness down the back of his leg to underneath his foot. Patient States it causes his foot to go numb, but patient also has diabetic neuropathy which effects both feet. 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 Patient chipped bones in both feet and injured his legs. Patient states it effected the way he moves and walked. Patient states shortly after he developed low back pain, worse on the left. MD diagnosed him with spinal stenosis from 3 disc bulges. Patient has had constant LBP ever since and only recently learned of chiropractic care being offered at the clinic. Patient leans forward when he walks and stands due to pain. Prednisone, extra strength tylenol, flexion, and heat make it better. Standing, walking, exercise, extension, and movement make it worse. Pain is an 8/10. Pain radiates down back of left leg to under the foot. Hurts worse in the morning. Pain is constant. No loss of bowel or bladder control.   O:   Posture exam - Right head tilt, left high shoulder, right thoracic translation, right high hip, anterior head posture. Patient leans forward to alleviate pain. ROM - Lumbar extention (active) was reduced with increase in pain. AROM of all Csp and remaining Lsp were WNL.  Spinal exam - Segmental Dsyfunction noted at C3 right, T4 right, T8 right, L1 right, L4 left, SI left PI. Hypertonic muscles noted in right Csp and Tsp, left Lsp and piriformis. Pain upon palpation at the spinal levels noted above. Ortho exam - Kemps, Straight leg raise, and yeoman's tests were positive on left. Max foraminal compression, jacksons, Csp distraction, braggards, hibbs, and Csp/Lsp valsalva tests were negative bilaterally.  Neuro exam - DTR's of C5-T1 and L4-S1 were 2. Motor strength of C5-T1 and L4-S1 were 5. Dermatome testing of C5-T1 and L4-S1 were normal bilaterally.  A: Chronic  phase. 1x EOW until next re-exam. This is the patient's first treatment.  P: SMT at the spinal levels listed above. Patient received care without incident.

## 2016-06-12 IMAGING — CR DG HIP (WITH OR WITHOUT PELVIS) 3-4V BILAT
1 series · 5 of 5 positions shown · non-contrast
Comparison: None.

CLINICAL DATA: Fell off a [REDACTED] in 7207, pain since, LEFT leg
numbness, pain in RIGHT leg radiating to his lower back, BILATERAL
hip pain

EXAM:
None

[Series 1: view not recorded · 0.14mm/px · 5 of 5 slices shown]
[im 1/5]
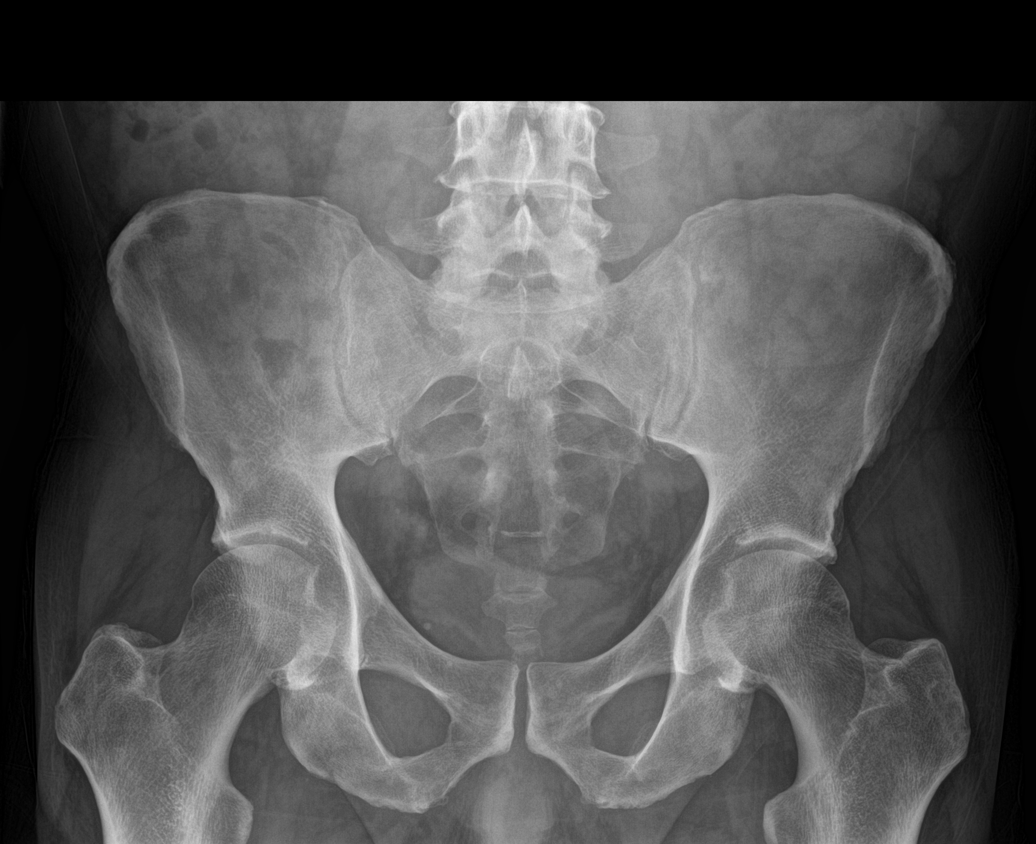
[im 2/5]
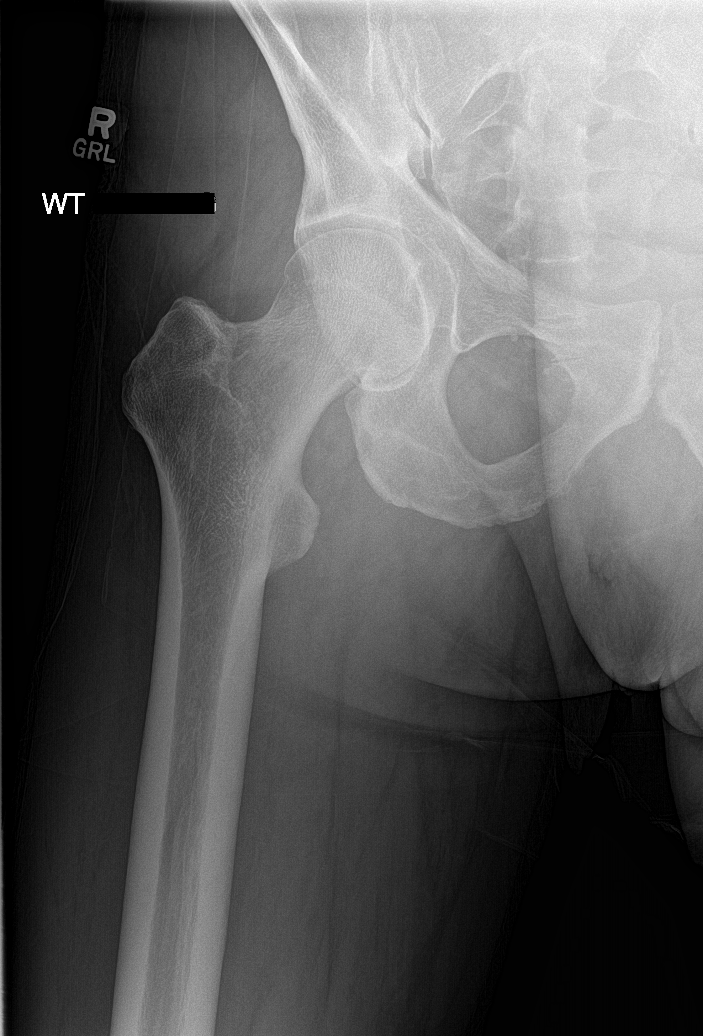
[im 3/5]
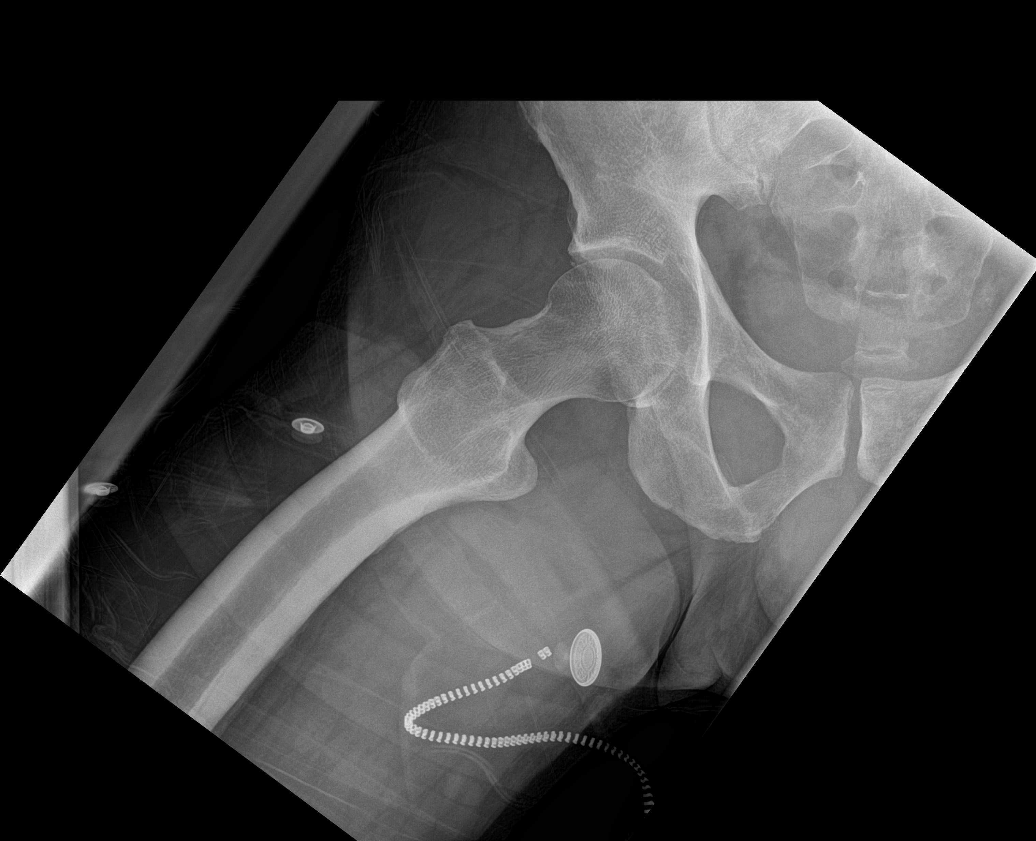
[im 4/5]
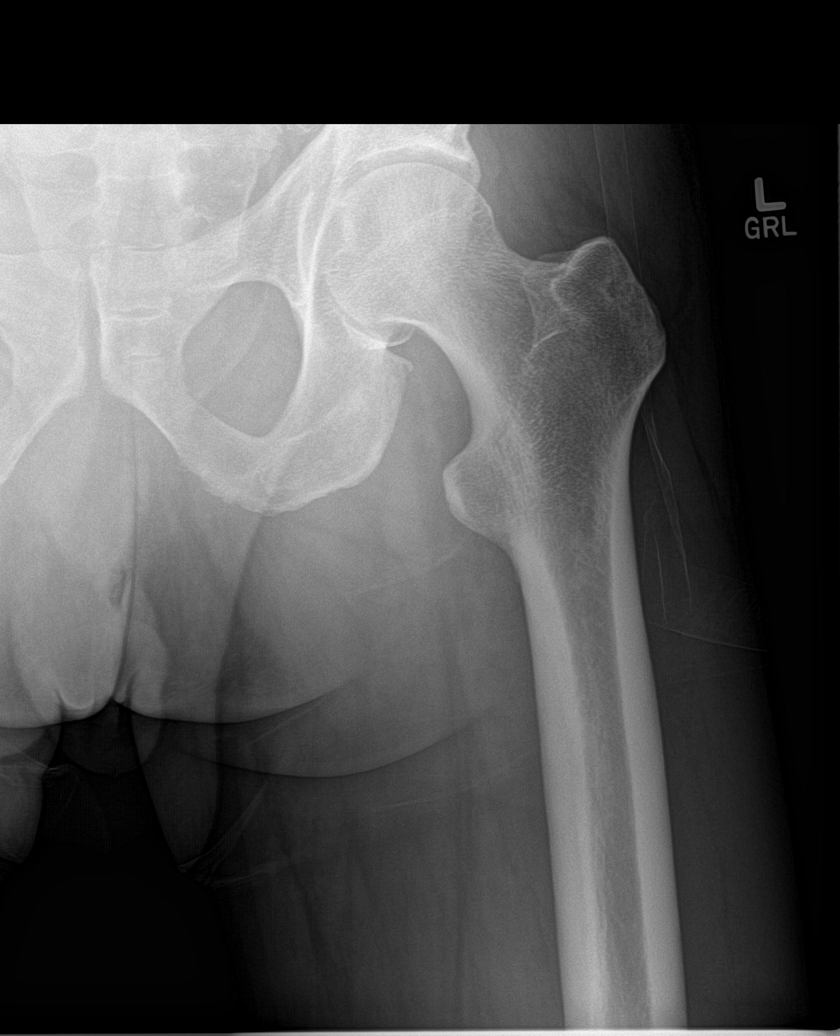
[im 5/5]
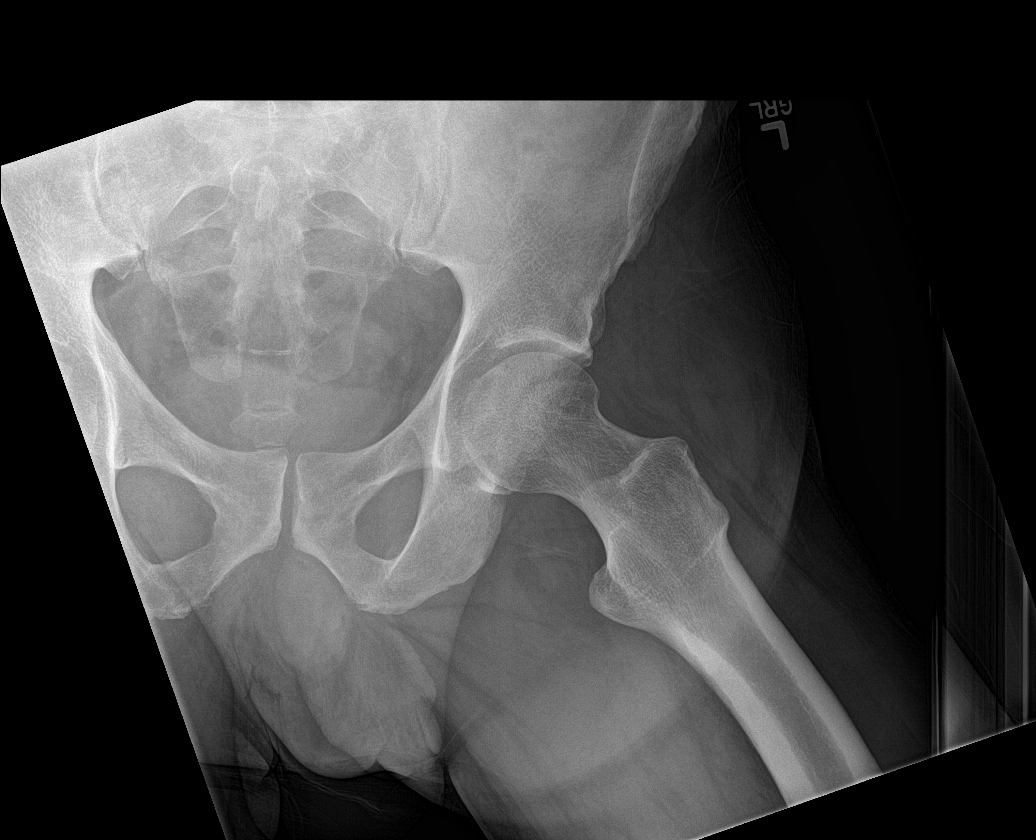

[5 of 5 positions shown; findings below may reference images not displayed]

FINDINGS: Symmetric hip and SI joints.

Osseous mineralization grossly normal for technique.

No acute fracture, dislocation, or bone destruction.
IMPRESSION: Normal exam.

## 2016-06-17 ENCOUNTER — Other Ambulatory Visit: Payer: Self-pay | Admitting: Nurse Practitioner

## 2016-06-19 NOTE — Telephone Encounter (Signed)
Placed signed application/script in [facility] folder for pickup. 

## 2016-06-25 ENCOUNTER — Ambulatory Visit: Payer: Self-pay | Admitting: Chiropractor

## 2016-06-27 ENCOUNTER — Telehealth: Payer: Self-pay | Admitting: Pharmacist

## 2016-06-27 NOTE — Telephone Encounter (Signed)
Faxed Novo Nordisk refill request for Levemir Vial-Inject 15 units under the skin twice a day.

## 2016-07-02 ENCOUNTER — Telehealth: Payer: Self-pay | Admitting: Pharmacy Technician

## 2016-07-02 NOTE — Telephone Encounter (Signed)
Patient provided updated financial information.  Approved to receive medication assistance through 2018, as long as patient's income does not exceed 250% FPL or pt does not obtain prescription insurance.  Jorge DacostaBetty J. Charlesetta Olson Care Manager [facility]

## 2016-07-03 ENCOUNTER — Telehealth: Payer: Self-pay

## 2016-07-03 ENCOUNTER — Other Ambulatory Visit: Payer: Self-pay | Admitting: Internal Medicine

## 2016-07-03 NOTE — Telephone Encounter (Signed)
[facility] for Vict[facility]za and Levemir placed for provider to sign.

## 2016-07-04 NOTE — Telephone Encounter (Signed)
Placed signed application/script in [facility] folder for pickup. 

## 2016-07-18 ENCOUNTER — Telehealth: Payer: Self-pay | Admitting: Pharmacist

## 2016-07-18 NOTE — Telephone Encounter (Signed)
07/18/16 Faxed Novo Nordisk application for renewal: Victoza Inject 1.2mg  once daily, Novofine 32G use daily with Victoza, Levemir Vials Inject 16 units daily at bedtime.

## 2016-07-18 NOTE — Telephone Encounter (Signed)
NEW NOTE 07/18/16 Faxed [employer] application for renewal: Victoza Inject 1.2mg  once daily, Novofine 32G use daily with Victoza, Levemir Vials Inject 16 units daily at bedtime. They likely will ask for a Medicaid denial due to no income.

## 2016-07-21 ENCOUNTER — Other Ambulatory Visit: Payer: Self-pay | Admitting: Internal Medicine

## 2016-07-21 DIAGNOSIS — E1142 Type 2 diabetes mellitus with diabetic polyneuropathy: Secondary | ICD-10-CM

## 2016-07-21 DIAGNOSIS — Z794 Long term (current) use of insulin: Principal | ICD-10-CM

## 2016-07-22 ENCOUNTER — Other Ambulatory Visit: Payer: Self-pay | Admitting: Internal Medicine

## 2016-07-22 DIAGNOSIS — Z794 Long term (current) use of insulin: Principal | ICD-10-CM

## 2016-07-22 DIAGNOSIS — E1142 Type 2 diabetes mellitus with diabetic polyneuropathy: Secondary | ICD-10-CM

## 2016-08-07 ENCOUNTER — Telehealth: Payer: Self-pay

## 2016-08-07 NOTE — Telephone Encounter (Signed)
Received PAP application from [facility] for Victoza placed for provider to sign. 

## 2016-08-19 ENCOUNTER — Telehealth: Payer: Self-pay | Admitting: Pharmacist

## 2016-08-19 NOTE — Telephone Encounter (Signed)
08/19/16 Faxed Novo Nordisk application for [employer] 1.8mg  daily & Novofine 32G tips-DOSE CHANGE.

## 2016-09-04 ENCOUNTER — Ambulatory Visit: Payer: Self-pay | Admitting: Adult Health Nurse Practitioner

## 2016-09-04 VITALS — BP 170/100 | HR 86 | Temp 97.8°F | Wt 201.5 lb

## 2016-09-04 DIAGNOSIS — Z794 Long term (current) use of insulin: Principal | ICD-10-CM

## 2016-09-04 DIAGNOSIS — I10 Essential (primary) hypertension: Secondary | ICD-10-CM

## 2016-09-04 DIAGNOSIS — E1142 Type 2 diabetes mellitus with diabetic polyneuropathy: Secondary | ICD-10-CM

## 2016-09-04 HISTORY — DX: Essential (primary) hypertension: I10

## 2016-09-04 LAB — GLUCOSE, POCT (MANUAL RESULT ENTRY): POC GLUCOSE: 113 mg/dL — AB (ref 70–99)

## 2016-09-04 MED ORDER — GABAPENTIN 400 MG PO CAPS: ORAL_CAPSULE | ORAL | 1 refills | Status: DC

## 2016-09-04 MED ORDER — NORTRIPTYLINE HCL 25 MG PO CAPS: 50.0000 mg | ORAL_CAPSULE | Freq: Every day | ORAL | 3 refills | Status: DC

## 2016-09-04 MED ORDER — LISINOPRIL 10 MG PO TABS: 10.0000 mg | ORAL_TABLET | Freq: Every day | ORAL | 3 refills | Status: DC

## 2016-09-04 NOTE — Progress Notes (Signed)
Patient: Jorge Olson Male    DOB: 01/01/1956   61 y.o.   MRN: [id number] Visit Date: 09/04/2016  Today's Provider: Jacelyn Pieah Doles-Johnson, NP   Chief Complaint  Patient presents with  . Follow-up   Subjective:    HPI   HTN:  Last BP was 152/89. No medications added- thought to be contributed to pain.   Currently on no medications to manage.          DM:  Pt states that he is not taking his Victoza x 5 weeks because it makes him sick on his stomach.  Last A1C was 7- 3 months ago.  CBGs have been averaging 80-130 in the am.  Pt states that he has been denied for his levemir.    Zocor was decreased to 10mg  on last ov.   Pt states that he cannot tolerate the chiropractor.  He states that it hurts worsse.  He states that he was told he will need surgery.  Pt states that the pain in his feet is worsened- would like to go up on Gabapentin dose.   No Known Allergies Previous Medications   ACETAMINOPHEN (TYLENOL) 650 MG CR TABLET    Take 650 mg by mouth as needed for pain (3 in the morning and 2 in the evening).   ASPIRIN 81 MG TABLET    Take 81 mg by mouth daily.   FEXOFENADINE (ALLEGRA) 180 MG TABLET    Take 1 tablet (180 mg total) by mouth daily.   GABAPENTIN (NEURONTIN) 300 MG CAPSULE    BY MOUTH, 3 CAPSULES IN THE MORNING AND 3 CAPSULES AT NIGHT   LEVEMIR 100 UNIT/ML INJECTION    INJECT 16 UNITS UNDER THE SKIN EVERY DAY AT BEDTIME   LIRAGLUTIDE 18 MG/3ML SOPN    Inject 0.3 mLs (1.8 mg total) into the skin every morning.   METFORMIN (GLUCOPHAGE) 1000 MG TABLET    TAKE 1 TABLET BY MOUTH 2 TIMES A DAY.   NAPROXEN (NAPROSYN) 500 MG TABLET    Take 1 tablet (500 mg total) by mouth 2 (two) times daily with a meal.   NORTRIPTYLINE (PAMELOR) 25 MG CAPSULE    TAKE 2 CAPSULES BY MOUTH AT BEDTIME   OMEPRAZOLE (PRILOSEC) 20 MG CAPSULE    Take 1 capsule (20 mg total) by mouth daily.   SIMVASTATIN (ZOCOR) 10 MG TABLET    Take 1 tablet (10 mg total) by mouth daily. Reported on 07/31/2015     Review of Systems  All other systems reviewed and are negative.   Social History  Substance Use Topics  . Smoking status: Current Every Day Smoker    Packs/day: 1.00    Types: Cigarettes  . Smokeless tobacco: Never Used     Comment: Smokes less than a pack a day.  . Alcohol use No   Objective:   BP (!) 170/100   Pulse 86   Temp 97.8 F (36.6 C)   Wt 201 lb 8 oz (91.4 kg)   BMI 27.33 kg/m   Physical Exam  Constitutional: He appears well-developed and well-nourished.  HENT:  Head: Normocephalic and atraumatic.  Cardiovascular: Normal rate and regular rhythm.   Pulmonary/Chest: Effort normal and breath sounds normal.  Abdominal: Soft. Bowel sounds are normal.  Vitals reviewed.       Assessment & Plan:         DM:  Controlled.  Continue off Victoza.  Will check A1C today.   Encourage diabetic diet and exercise.  Attempt to  get levemir.   HTN:  Newly diagnosed.  Start on lisinopril 10mg .  Low sodium diet.  FU in 4 weeks for BP check.   Continue current Zocor dose.  Increase Gabapentin to 800mg  TID.      Jacelyn Pi, NP   [facility]

## 2016-09-05 ENCOUNTER — Other Ambulatory Visit: Payer: Self-pay | Admitting: Internal Medicine

## 2016-09-05 LAB — SPECIMEN STATUS

## 2016-09-05 LAB — SPECIMEN STATUS REPORT

## 2016-09-06 LAB — COMPREHENSIVE METABOLIC PANEL
A/G RATIO: 2 (ref 1.2–2.2)
ALT: 25 IU/L (ref 0–44)
AST: 19 IU/L (ref 0–40)
Albumin: 4.4 g/dL (ref 3.5–5.5)
Alkaline Phosphatase: 102 IU/L (ref 39–117)
BILIRUBIN TOTAL: 0.8 mg/dL (ref 0.0–1.2)
BUN/Creatinine Ratio: 14 (ref 9–20)
BUN: 19 mg/dL (ref 6–24)
CO2: 26 mmol/L (ref 18–29)
CREATININE: 1.38 mg/dL — AB (ref 0.76–1.27)
Calcium: 9.8 mg/dL (ref 8.7–10.2)
Chloride: 101 mmol/L (ref 96–106)
GFR, EST AFRICAN AMERICAN: 65 mL/min/{1.73_m2} (ref >59)
GFR, EST NON AFRICAN AMERICAN: 56 mL/min/{1.73_m2} — AB (ref >59)
Globulin, Total: 2.2 g/dL (ref 1.5–4.5)
Glucose: 125 mg/dL — ABNORMAL HIGH (ref 65–99)
Potassium: 4.1 mmol/L (ref 3.5–5.2)
Sodium: 141 mmol/L (ref 134–144)
TOTAL PROTEIN: 6.6 g/dL (ref 6.0–8.5)

## 2016-09-08 LAB — HGB A1C W/O EAG: Hgb A1c MFr Bld: 7.8 % — ABNORMAL HIGH (ref 4.8–5.6)

## 2016-09-08 LAB — SPECIMEN STATUS REPORT

## 2016-09-12 ENCOUNTER — Telehealth: Payer: Self-pay

## 2016-09-12 NOTE — Telephone Encounter (Signed)
Called pt with results. PT verbalized understanding. 

## 2016-09-12 NOTE — Telephone Encounter (Signed)
-----   Message from Jacelyn Pieah Doles-Johnson, NP sent at 09/11/2016  5:41 PM EDT ----- A1C increased to 7.8. Monitor diet.

## 2016-09-29 ENCOUNTER — Other Ambulatory Visit: Payer: Self-pay | Admitting: Internal Medicine

## 2016-09-29 DIAGNOSIS — E1142 Type 2 diabetes mellitus with diabetic polyneuropathy: Secondary | ICD-10-CM

## 2016-09-29 DIAGNOSIS — Z794 Long term (current) use of insulin: Principal | ICD-10-CM

## 2016-10-09 ENCOUNTER — Ambulatory Visit: Payer: Self-pay | Admitting: Adult Health Nurse Practitioner

## 2016-10-09 VITALS — BP 136/82 | HR 102 | Temp 98.2°F | Ht 72.0 in | Wt 198.5 lb

## 2016-10-09 DIAGNOSIS — E1142 Type 2 diabetes mellitus with diabetic polyneuropathy: Secondary | ICD-10-CM

## 2016-10-09 DIAGNOSIS — I10 Essential (primary) hypertension: Secondary | ICD-10-CM

## 2016-10-09 DIAGNOSIS — Z794 Long term (current) use of insulin: Secondary | ICD-10-CM

## 2016-10-09 MED ORDER — OMEPRAZOLE 20 MG PO CPDR: 20.0000 mg | DELAYED_RELEASE_CAPSULE | Freq: Every day | ORAL | 2 refills | Status: DC

## 2016-10-09 MED ORDER — NORTRIPTYLINE HCL 25 MG PO CAPS: 50.0000 mg | ORAL_CAPSULE | Freq: Every day | ORAL | 3 refills | Status: DC

## 2016-10-09 MED ORDER — GABAPENTIN 400 MG PO CAPS: ORAL_CAPSULE | ORAL | 1 refills | Status: DC

## 2016-10-09 MED ORDER — METFORMIN HCL 1000 MG PO TABS: ORAL_TABLET | ORAL | 99 refills | Status: DC

## 2016-10-09 NOTE — Progress Notes (Signed)
Glucose 145

## 2016-10-09 NOTE — Progress Notes (Signed)
  Patient: Jorge Olson Male    DOB: 01/29/1956   61 y.o.   MRN: [id number] Visit Date: 10/09/2016  Today's Provider: Jacelyn Pieah Doles-Johnson, NP   Chief Complaint  Patient presents with  . Follow-up   Subjective:    HPI  Here for BP check.  Newly diagnosed on last visit- started on Lisinopril 10mg  daily.  Tolerating medication well.   Pt states that he is back on Victoza.  Started on 1.6 working his way up to 1.8.    No Known Allergies Previous Medications   ACETAMINOPHEN (TYLENOL) 650 MG CR TABLET    Take 650 mg by mouth as needed for pain (3 in the morning and 2 in the evening).   ASPIRIN 81 MG TABLET    Take 81 mg by mouth daily.   FEXOFENADINE (ALLEGRA) 180 MG TABLET    Take 1 tablet (180 mg total) by mouth daily.   GABAPENTIN (NEURONTIN) 400 MG CAPSULE    Take 2 tablet TID   LEVEMIR 100 UNIT/ML INJECTION    INJECT 16 UNITS UNDER THE SKIN EVERY DAY AT BEDTIME   LIRAGLUTIDE 18 MG/3ML SOPN    Inject 0.3 mLs (1.8 mg total) into the skin every morning.   LISINOPRIL (PRINIVIL,ZESTRIL) 10 MG TABLET    Take 1 tablet (10 mg total) by mouth daily.   METFORMIN (GLUCOPHAGE) 1000 MG TABLET    TAKE 1 TABLET BY MOUTH 2 TIMES A DAY.   NAPROXEN (NAPROSYN) 500 MG TABLET    TAKE 1 TABLET BY MOUTH 2 TIMES A DAY WITH A MEAL.   NORTRIPTYLINE (PAMELOR) 25 MG CAPSULE    Take 2 capsules (50 mg total) by mouth at bedtime.   OMEPRAZOLE (PRILOSEC) 20 MG CAPSULE    Take 1 capsule (20 mg total) by mouth daily.   SIMVASTATIN (ZOCOR) 10 MG TABLET    Take 1 tablet (10 mg total) by mouth daily. Reported on 07/31/2015    Review of Systems  All other systems reviewed and are negative.   Social History  Substance Use Topics  . Smoking status: Current Every Day Smoker    Packs/day: 1.00    Types: Cigarettes  . Smokeless tobacco: Never Used     Comment: Smokes less than a pack a day.  . Alcohol use No   Objective:   BP 136/82   Pulse (!) 102   Temp 98.2 F (36.8 C)   Ht 6' (1.829 m)   Wt 198 lb 8 oz  (90 kg)   BMI 26.92 kg/m   Physical Exam  Constitutional: He is oriented to person, place, and time. He appears well-developed and well-nourished.  Cardiovascular: Normal rate, regular rhythm and normal heart sounds.   Pulmonary/Chest: Effort normal and breath sounds normal.  Neurological: He is alert and oriented to person, place, and time.  Vitals reviewed.       Assessment & Plan:         HTN:  Improved.   Goal BP <140/80.  Continue current medication regimen.  Encourage low salt diet and exercise. \  Continue Victoza.   Medications refilled.   Jacelyn Pieah Doles-Johnson, NP   [facility] [place]

## 2016-11-20 ENCOUNTER — Telehealth: Payer: Self-pay | Admitting: Pharmacist

## 2016-11-20 NOTE — Telephone Encounter (Signed)
11/20/16 Faxed Novo Nordisk Doctor, [facility] practiceenewal application for ViacomLevemir Vials Inject 15 units under the skin twice daily (max daily dose 30 units), Victoza pens Inject 1.8mg  daily, Novofine 32G tips Use daily with Victoza. Also I am mailing patient a letter letting him know that due to his income this company will require a Medicaid denial. We have a denial on file but it is dated 2016, they will require a more recent denial. Jorge Olson

## 2016-12-11 ENCOUNTER — Ambulatory Visit: Payer: Self-pay | Admitting: Urology

## 2016-12-11 VITALS — BP 140/80 | HR 102 | Temp 97.7°F | Wt 201.0 lb

## 2016-12-11 DIAGNOSIS — Z794 Long term (current) use of insulin: Secondary | ICD-10-CM

## 2016-12-11 DIAGNOSIS — E119 Type 2 diabetes mellitus without complications: Secondary | ICD-10-CM

## 2016-12-11 DIAGNOSIS — I10 Essential (primary) hypertension: Secondary | ICD-10-CM

## 2016-12-11 DIAGNOSIS — E1142 Type 2 diabetes mellitus with diabetic polyneuropathy: Secondary | ICD-10-CM

## 2016-12-11 LAB — GLUCOSE, POCT (MANUAL RESULT ENTRY): POC GLUCOSE: 254 mg/dL — AB (ref 70–99)

## 2016-12-11 MED ORDER — OMEPRAZOLE 20 MG PO CPDR: 20.0000 mg | DELAYED_RELEASE_CAPSULE | Freq: Every day | ORAL | 2 refills | Status: DC

## 2016-12-11 MED ORDER — NAPROXEN 500 MG PO TABS: ORAL_TABLET | ORAL | 0 refills | Status: DC

## 2016-12-11 MED ORDER — GABAPENTIN 400 MG PO CAPS: ORAL_CAPSULE | ORAL | 1 refills | Status: DC

## 2016-12-11 MED ORDER — INSULIN DETEMIR 100 UNIT/ML ~~LOC~~ SOLN: SUBCUTANEOUS | 2 refills | Status: DC

## 2016-12-11 MED ORDER — METFORMIN HCL 1000 MG PO TABS: ORAL_TABLET | ORAL | 99 refills | Status: DC

## 2016-12-11 MED ORDER — FEXOFENADINE HCL 180 MG PO TABS
180.0000 mg | ORAL_TABLET | Freq: Every day | ORAL | 1 refills | Status: DC
Start: 2016-12-11 — End: 2018-01-05

## 2016-12-11 MED ORDER — SIMVASTATIN 10 MG PO TABS: 10.0000 mg | ORAL_TABLET | Freq: Every day | ORAL | 3 refills | Status: DC

## 2016-12-11 MED ORDER — LISINOPRIL 10 MG PO TABS: 10.0000 mg | ORAL_TABLET | Freq: Every day | ORAL | 3 refills | Status: DC

## 2016-12-11 NOTE — Progress Notes (Signed)
  Patient: Jorge Olson Male    DOB: 08/30/1955   61 y.o.   MRN: [id number] Visit Date: 12/11/2016  Today's Provider: [facility]   Chief Complaint  Patient presents with  . Follow-up   Subjective:    HPI  HTN:  BP 140/80 No medications added- thought to be contributed to pain.   Currently on no medications to manage.          DM:  Pt states that he is not taking his Victoza x 5 weeks because it makes him sick on his stomach.  Last A1C was 7- 3 months ago.  CBGs have been averaging 80-130 in the am.  Levemir has not been approved  Zocor was decreased to 10mg  on last ov.   Pt states that he cannot tolerate the chiropractor.  He states that it hurts worse.  He states that he was told he will need surgery.  Pt states that the pain in his feet is worsened- would like to go up on Gabapentin dose.   No Known Allergies Previous Medications   ACETAMINOPHEN (TYLENOL) 650 MG CR TABLET    Take 650 mg by mouth as needed for pain (3 in the morning and 2 in the evening).   ASPIRIN 81 MG TABLET    Take 81 mg by mouth daily.   LIRAGLUTIDE 18 MG/3ML SOPN    Inject 0.3 mLs (1.8 mg total) into the skin every morning.   NORTRIPTYLINE (PAMELOR) 25 MG CAPSULE    Take 2 capsules (50 mg total) by mouth at bedtime.    Review of Systems  All other systems reviewed and are negative.   Social History  Substance Use Topics  . Smoking status: Current Every Day Smoker    Packs/day: 1.00    Types: Cigarettes  . Smokeless tobacco: Never Used     Comment: Smokes less than a pack a day.  . Alcohol use No   Objective:   BP 140/80   Pulse (!) 102   Temp 97.7 F (36.5 C)   Wt 201 lb (91.2 kg)   BMI 27.26 kg/m   Physical Exam  Constitutional: He appears well-developed and well-nourished.  HENT:  Head: Normocephalic and atraumatic.  Cardiovascular: Normal rate and regular rhythm.   Pulmonary/Chest: Effort normal and breath sounds normal.  Abdominal: Soft. Bowel sounds are  normal.  Vitals reviewed.       Assessment & Plan:   DM:  Controlled.  Continue off Victoza.  Will check A1C today.   Encourage diabetic diet and exercise.  Continue Levemir 16 U qhs, metformin 1000 mg bid - patient uses 6 mm 31 gauge with half units and cannot afford them out of pocket  HTN:  Still not at goal <140/80 Newly diagnosed.  Increase lisinopril to 20 mg.  Low sodium diet.  FU in 4 weeks for BP check.   Continue current Zocor dose.  Continue Gabapentin to 800mg  TID.      [facility]   [facility] [place]

## 2016-12-23 ENCOUNTER — Other Ambulatory Visit: Payer: Self-pay | Admitting: Internal Medicine

## 2016-12-23 ENCOUNTER — Telehealth: Payer: Self-pay

## 2016-12-23 DIAGNOSIS — E1142 Type 2 diabetes mellitus with diabetic polyneuropathy: Secondary | ICD-10-CM

## 2016-12-23 DIAGNOSIS — Z794 Long term (current) use of insulin: Principal | ICD-10-CM

## 2016-12-23 NOTE — Telephone Encounter (Signed)
Received PAP application from [facility] for Levemir placed for provider to sign. 

## 2016-12-23 NOTE — Telephone Encounter (Signed)
Placed signed application/script in [facility] folder for pickup. 

## 2016-12-24 ENCOUNTER — Telehealth: Payer: Self-pay | Admitting: Pharmacist

## 2016-12-24 NOTE — Telephone Encounter (Signed)
12/24/2016 Faxed Novo Nordisk a refill request on Levemir Vial for dose change Inject 16 units under the skin twice daily--Max daily dose 32 units #4.Forde RadonAJ

## 2017-01-01 ENCOUNTER — Other Ambulatory Visit: Payer: Self-pay

## 2017-01-01 DIAGNOSIS — E1142 Type 2 diabetes mellitus with diabetic polyneuropathy: Secondary | ICD-10-CM

## 2017-01-01 DIAGNOSIS — Z794 Long term (current) use of insulin: Principal | ICD-10-CM

## 2017-01-02 LAB — HEMOGLOBIN A1C
ESTIMATED AVERAGE GLUCOSE: 166 mg/dL
HEMOGLOBIN A1C: 7.4 % — AB (ref 4.8–5.6)

## 2017-01-08 ENCOUNTER — Ambulatory Visit: Payer: Self-pay

## 2017-01-20 ENCOUNTER — Ambulatory Visit: Payer: Self-pay | Admitting: Adult Health Nurse Practitioner

## 2017-01-20 VITALS — BP 131/74 | HR 90 | Temp 98.1°F | Ht 72.0 in

## 2017-01-20 DIAGNOSIS — E119 Type 2 diabetes mellitus without complications: Secondary | ICD-10-CM

## 2017-01-20 DIAGNOSIS — I10 Essential (primary) hypertension: Secondary | ICD-10-CM

## 2017-01-20 LAB — GLUCOSE, POCT (MANUAL RESULT ENTRY): POC Glucose: 200 mg/dl — AB (ref 70–99)

## 2017-01-20 NOTE — Progress Notes (Signed)
Subjective:    Patient ID: Jorge Olson, male    DOB: 17-Feb-1956, 61 y.o.   MRN: [id number]  HPI   Pt is here for a BP check. At last visit, BP was 140/80. Pt reports he needs a rx for a walker for his disability. He reports he sometimes uses his current walker.   Patient Active Problem List   Diagnosis Date Noted  . Hypertension 09/04/2016  . Segmental and somatic dysfunction of lumbar region 06/11/2016  . Sciatica, left side 06/11/2016  . Segmental and somatic dysfunction of thoracic region 06/11/2016  . Muscle spasm of back 06/11/2016  . Spinal stenosis 06/05/2016  . Hyperlipidemia 04/19/2015  . Diabetes (HCC) 12/16/2014   Allergies as of 01/20/2017   No Known Allergies     Medication List       Accurate as of 01/20/17  6:34 PM. Always use your most recent med list.          acetaminophen 650 MG CR tablet Commonly known as:  TYLENOL Take 650 mg by mouth as needed for pain (3 in the morning and 2 in the evening).   aspirin 81 MG tablet Take 81 mg by mouth daily.   fexofenadine 180 MG tablet Commonly known as:  ALLEGRA Take 1 tablet (180 mg total) by mouth daily.   gabapentin 400 MG capsule Commonly known as:  NEURONTIN Take 2 tablet TID   insulin detemir 100 UNIT/ML injection Commonly known as:  LEVEMIR INJECT 16 UNITS UNDER THE SKIN EVERY DAY AT BEDTIME   liraglutide 18 MG/3ML Sopn Inject 0.3 mLs (1.8 mg total) into the skin every morning.   lisinopril 10 MG tablet Commonly known as:  PRINIVIL,ZESTRIL Take 1 tablet (10 mg total) by mouth daily.   metFORMIN 1000 MG tablet Commonly known as:  GLUCOPHAGE TAKE 1 TABLET BY MOUTH 2 TIMES A DAY.   naproxen 500 MG tablet Commonly known as:  NAPROSYN TAKE 1 TABLET BY MOUTH 2 TIMES A DAY WITH A MEAL.   nortriptyline 25 MG capsule Commonly known as:  PAMELOR Take 2 capsules (50 mg total) by mouth at bedtime.   omeprazole 20 MG capsule Commonly known as:  PRILOSEC Take 1 capsule (20 mg total) by mouth  daily.   simvastatin 10 MG tablet Commonly known as:  ZOCOR Take 1 tablet (10 mg total) by mouth daily. Reported on 07/31/2015            Discharge Care Instructions        Start     Ordered   01/20/17 0000  POCT Glucose (CBG)     01/20/17 1822       Review of Systems Pt reports he takes 1/2 dose of Victoza and 2 gabapentin 3xs/day for neuropathy. Doing well on his current regimen.      Objective:   Physical Exam  Constitutional: He is oriented to person, place, and time. He appears well-developed and well-nourished.  HENT:  Head: Atraumatic.  Right Ear: External ear normal.  Neck: Neck supple.  Cardiovascular: Normal rate, regular rhythm and normal heart sounds.   Pulmonary/Chest: Effort normal and breath sounds normal.  Abdominal: Soft. Bowel sounds are normal.  Neurological: He is alert and oriented to person, place, and time.  Skin: Skin is warm and dry.    BP 131/74 (BP Location: Right Arm, Patient Position: Sitting, Cuff Size: Normal)   Pulse 90   Temp 98.1 F (36.7 C)   Ht 6' (1.829 m)  Assessment & Plan:   BP is stable. No changes.  Reviewed labs.  A1c 7.4- improved from last visit. Continue current therapy.  Lab today: [facility]

## 2017-01-21 LAB — COMPREHENSIVE METABOLIC PANEL
ALK PHOS: 84 IU/L (ref 39–117)
ALT: 28 IU/L (ref 0–44)
AST: 21 IU/L (ref 0–40)
Albumin/Globulin Ratio: 2.3 — ABNORMAL HIGH (ref 1.2–2.2)
Albumin: 4.4 g/dL (ref 3.5–5.5)
BILIRUBIN TOTAL: 0.6 mg/dL (ref 0.0–1.2)
BUN/Creatinine Ratio: 12 (ref 9–20)
BUN: 18 mg/dL (ref 6–24)
CHLORIDE: 102 mmol/L (ref 96–106)
CO2: 25 mmol/L (ref 20–29)
Calcium: 9.4 mg/dL (ref 8.7–10.2)
Creatinine, Ser: 1.49 mg/dL — ABNORMAL HIGH (ref 0.76–1.27)
GFR calc Af Amer: 59 mL/min/{1.73_m2} — ABNORMAL LOW (ref >59)
GFR calc non Af Amer: 51 mL/min/{1.73_m2} — ABNORMAL LOW (ref >59)
GLUCOSE: 204 mg/dL — AB (ref 65–99)
Globulin, Total: 1.9 g/dL (ref 1.5–4.5)
Potassium: 4.4 mmol/L (ref 3.5–5.2)
Sodium: 142 mmol/L (ref 134–144)
Total Protein: 6.3 g/dL (ref 6.0–8.5)

## 2017-01-23 ENCOUNTER — Telehealth: Payer: Self-pay

## 2017-01-23 NOTE — Telephone Encounter (Signed)
-----   Message from Jacelyn Pi, NP sent at 01/22/2017  5:31 PM EDT ----- Kidney function is slighly worsened, monitor use of Naproxen or NSAIDS.

## 2017-01-23 NOTE — Telephone Encounter (Signed)
Called pt with results. Pt verbalized understanding.  

## 2017-03-11 ENCOUNTER — Other Ambulatory Visit: Payer: Self-pay | Admitting: Internal Medicine

## 2017-03-11 ENCOUNTER — Telehealth: Payer: Self-pay | Admitting: Pharmacist

## 2017-03-11 DIAGNOSIS — E1142 Type 2 diabetes mellitus with diabetic polyneuropathy: Secondary | ICD-10-CM

## 2017-03-11 DIAGNOSIS — Z794 Long term (current) use of insulin: Principal | ICD-10-CM

## 2017-03-11 NOTE — Telephone Encounter (Signed)
--   Jorge Olson - Wednesday, March 11, 2017 11:28 AM -- [employer]axed [employer] a refill request for Levemir Vials Inject 16 units at bedtime # 2, Victoza Inject 1.8mg  once daily, #4 & Novofine 32G tips use daily with Victoza,#2.

## 2017-04-02 ENCOUNTER — Ambulatory Visit: Payer: Self-pay | Admitting: Adult Health Nurse Practitioner

## 2017-04-02 VITALS — BP 156/88 | Temp 98.0°F | Wt 203.8 lb

## 2017-04-02 DIAGNOSIS — E119 Type 2 diabetes mellitus without complications: Secondary | ICD-10-CM

## 2017-04-02 DIAGNOSIS — E785 Hyperlipidemia, unspecified: Secondary | ICD-10-CM

## 2017-04-02 DIAGNOSIS — I10 Essential (primary) hypertension: Secondary | ICD-10-CM

## 2017-04-02 MED ORDER — LISINOPRIL 10 MG PO TABS: 10.0000 mg | ORAL_TABLET | Freq: Every day | ORAL | 3 refills | Status: DC

## 2017-04-02 MED ORDER — NORTRIPTYLINE HCL 25 MG PO CAPS: 50.0000 mg | ORAL_CAPSULE | Freq: Every day | ORAL | 1 refills | Status: DC

## 2017-04-02 NOTE — Progress Notes (Signed)
  Patient: Jorge Olson Male    DOB: 10/10/1955   61 y.o.   MRN: [id number] Visit Date: 04/02/2017  Today's Provider: [facility]   Chief Complaint  Patient presents with  . Follow-up  . Stye    rt eye   Subjective:    Jorge Olson is 61 y/o with HTN, DM here for f/u of DM  DM Self-changed victoza from 1.8 to 1.2 due to stomach upset. Otherwise adherent to med regimen. BS over past month in evening 119-130. Worried he will run out of needles for insulin because medication management does not have them. Diet okay, some fruits/vegetables. Some crackers/bread.  one mountain dew every day. No exercise. Last eye exam 2016.   HTN Ran out of lisinopril one month ago. No chest pain. No SOB. No dizziness.  Stye 2 days ago lower right eye. Scratched with finger and cleaned with vizine. No drainage. No changes to vision.   Smoking Has thought about quitting, but not today. Not interested in talking to LCSW about quitting.  Depressive symptoms Long history of depressive symptoms. Previously well controlled with zoloft, d/ced bc of insurance. Has been taking pamelor over past 1.5 years. Ran out of meds one month ago; mood slightly depressed since then. No SI/HI       No Known Allergies This SmartLink is deprecated. Use AVSMEDLIST instead to display the medication list for a patient.  Review of Systems  All other systems reviewed and are negative.   Social History   Tobacco Use  . Smoking status: Current Every Day Smoker    Packs/day: 1.00    Types: Cigarettes  . Smokeless tobacco: Never Used  . Tobacco comment: Smokes less than a pack a day.  Substance Use Topics  . Alcohol use: No   Objective:   BP (!) 156/88 (BP Location: Left Arm, Patient Position: Sitting, Cuff Size: Normal)   Temp 98 F (36.7 C) (Oral)   Wt 203 lb 12.8 oz (92.4 kg)   BMI 27.64 kg/m   Physical Exam  Constitutional: He is oriented to person, place, and time. He appears well-developed and  well-nourished.  HENT:  Head: Normocephalic and atraumatic.  Eyes: Conjunctivae are normal.  Cardiovascular: Normal rate, regular rhythm and normal heart sounds. Exam reveals no gallop and no friction rub.  No murmur heard. Pulmonary/Chest: Effort normal and breath sounds normal.  Abdominal: Soft. Bowel sounds are normal. He exhibits no distension.  Neurological: He is alert and oriented to person, place, and time.  Skin: Skin is warm and dry.  Psychiatric: He has a normal mood and affect.        Assessment & Plan:     Jorge Olson is 61 y/o with HTN, DM here for f/u of DM  DM A1c today. Continue medication regimen.   HTN Elevated today. Restarted lisinopril. Continue current regimen.   Stye Recommended to not manipulate with hands. Recommended warm compress.   Depressive symptoms Long hx of depressive symptoms, worse currently since off meds. Recommended [facility] visit to potentially start SSRI in lieu of pamelor.         [facility]   [facility] [place] [place]

## 2017-04-03 LAB — HEMOGLOBIN A1C
ESTIMATED AVERAGE GLUCOSE: 151 mg/dL
HEMOGLOBIN A1C: 6.9 % — AB (ref 4.8–5.6)

## 2017-04-10 ENCOUNTER — Other Ambulatory Visit: Payer: Self-pay | Admitting: Urology

## 2017-04-10 DIAGNOSIS — E1142 Type 2 diabetes mellitus with diabetic polyneuropathy: Secondary | ICD-10-CM

## 2017-04-10 DIAGNOSIS — Z794 Long term (current) use of insulin: Principal | ICD-10-CM

## 2017-04-30 ENCOUNTER — Ambulatory Visit: Payer: Self-pay | Admitting: Adult Health Nurse Practitioner

## 2017-04-30 DIAGNOSIS — J069 Acute upper respiratory infection, unspecified: Secondary | ICD-10-CM | POA: Insufficient documentation

## 2017-04-30 MED ORDER — GUAIFENESIN 100 MG/5ML PO SOLN: 5.0000 mL | ORAL | 0 refills | Status: DC | PRN

## 2017-04-30 MED ORDER — AZITHROMYCIN 250 MG PO TABS: ORAL_TABLET | ORAL | 0 refills | Status: DC

## 2017-04-30 MED ORDER — ALBUTEROL SULFATE HFA 108 (90 BASE) MCG/ACT IN AERS: 1.0000 | INHALATION_SPRAY | Freq: Four times a day (QID) | RESPIRATORY_TRACT | 3 refills | Status: DC | PRN

## 2017-04-30 NOTE — Progress Notes (Signed)
Subjective:    Patient ID: Jorge Olson, male    DOB: [birth year], 62 y.o.   MRN: [id number]  HPI   Jorge Olson is a 62 yo male here for a cough and nasal congestion since 12/24. He has been taking Nyquil. He had chills. He denies diarrhea and vomiting. He reports extreme coughing at night for a week but now it has improved. He denies sinus pain.    Patient Active Problem List   Diagnosis Date Noted  . Hypertension 09/04/2016  . Segmental and somatic dysfunction of lumbar region 06/11/2016  . Sciatica, left side 06/11/2016  . Segmental and somatic dysfunction of thoracic region 06/11/2016  . Muscle spasm of back 06/11/2016  . Spinal stenosis 06/05/2016  . Hyperlipidemia 04/19/2015  . Diabetes mellitus without complication (HCC) 12/16/2014   Allergies as of 04/30/2017   No Known Allergies     Medication List        Accurate as of 04/30/17  6:30 PM. Always use your most recent med list.          acetaminophen 650 MG CR tablet Commonly known as:  TYLENOL Take 650 mg by mouth as needed for pain (3 in the morning and 2 in the evening).   aspirin 81 MG tablet Take 81 mg by mouth daily.   fexofenadine 180 MG tablet Commonly known as:  ALLEGRA Take 1 tablet (180 mg total) by mouth daily.   gabapentin 400 MG capsule Commonly known as:  NEURONTIN TAKE TWO TABLETS BY MOUTH 3 TIMES A DAY   insulin detemir 100 UNIT/ML injection Commonly known as:  LEVEMIR INJECT 16 UNITS UNDER THE SKIN EVERY DAY AT BEDTIME   liraglutide 18 MG/3ML Sopn Commonly known as:  VICTOZA Inject 0.3 mLs (1.8 mg total) into the skin every morning.   lisinopril 10 MG tablet Commonly known as:  PRINIVIL,ZESTRIL Take 1 tablet (10 mg total) by mouth daily.   metFORMIN 1000 MG tablet Commonly known as:  GLUCOPHAGE TAKE 1 TABLET BY MOUTH 2 TIMES A DAY.   naproxen 500 MG tablet Commonly known as:  NAPROSYN TAKE 1 TABLET BY MOUTH 2 TIMES A DAY WITH A MEAL.   nortriptyline 25 MG capsule Commonly  known as:  PAMELOR Take 2 capsules (50 mg total) by mouth at bedtime.   omeprazole 20 MG capsule Commonly known as:  PRILOSEC TAKE ONE CAPSULE BY MOUTH EVERY DAY   simvastatin 10 MG tablet Commonly known as:  ZOCOR Take 1 tablet (10 mg total) by mouth daily. Reported on 07/31/2015        Review of Systems Pulse is elevated at 104. BP is improved.       Objective:   Physical Exam  Constitutional: He is oriented to person, place, and time. He appears well-developed and well-nourished.  HENT:  Nose: Right sinus exhibits no maxillary sinus tenderness and no frontal sinus tenderness. Left sinus exhibits no maxillary sinus tenderness and no frontal sinus tenderness.  Cardiovascular: Regular rhythm and normal heart sounds.  Pulmonary/Chest: He has wheezes in the right lower field.  Lymphadenopathy:    He has no cervical adenopathy.  Neurological: He is alert and oriented to person, place, and time.    BP 106/68   Pulse (!) 104   Temp 97.8 F (36.6 C)   Wt 195 lb 3.2 oz (88.5 kg)   BMI 26.47 kg/m        Assessment & Plan:    RX Albuterol inhaler, Zpac, and Robitussum syrup.  Hold Notriptyline while taking Azithromycin.  Encouraged increased fluids.  Supportive care.

## 2017-05-05 ENCOUNTER — Telehealth: Payer: Self-pay | Admitting: Pharmacist

## 2017-05-05 NOTE — Telephone Encounter (Signed)
05/05/17 Received pharmacy printout for new med-Ventolin HFA 90mcg Inhale 1-2 puffs every 6 hours as needed for wheezing or shortness of breath. Printed [employer] application will mail patient his portion to sign & return to us, [facility].Forde RadonAJ

## 2017-05-14 ENCOUNTER — Ambulatory Visit: Payer: Self-pay | Admitting: Ophthalmology

## 2017-05-14 ENCOUNTER — Other Ambulatory Visit: Payer: Self-pay | Admitting: Ophthalmology

## 2017-05-14 LAB — HM DIABETES EYE EXAM

## 2017-05-26 ENCOUNTER — Telehealth: Payer: Self-pay | Admitting: Pharmacist

## 2017-05-26 NOTE — Telephone Encounter (Signed)
05/26/17 I have received the signed Ventolin script back from provider, holding for patient to return his portion.Forde RadonAJ

## 2017-06-03 ENCOUNTER — Ambulatory Visit: Payer: Self-pay | Admitting: Ophthalmology

## 2017-06-03 ENCOUNTER — Other Ambulatory Visit: Payer: Self-pay | Admitting: Internal Medicine

## 2017-06-03 DIAGNOSIS — Z794 Long term (current) use of insulin: Principal | ICD-10-CM

## 2017-06-03 DIAGNOSIS — E1142 Type 2 diabetes mellitus with diabetic polyneuropathy: Secondary | ICD-10-CM

## 2017-06-04 ENCOUNTER — Telehealth: Payer: Self-pay | Admitting: Pharmacist

## 2017-06-04 NOTE — Telephone Encounter (Signed)
06/04/17 Christan brought me a pharmacy printout and stated that patient has decreased his dose from 1.8mg  to 1.2mg  for Victoza. I have changed in patient med list-not ordering any meds at this point. We have meds in our refrig for patient when he needs. Also we [facility] change.Forde RadonAJ

## 2017-06-18 ENCOUNTER — Telehealth: Payer: Self-pay | Admitting: Pharmacy Technician

## 2017-06-18 NOTE — Telephone Encounter (Signed)
Received updated proof of income.  Patient eligible to receive medication assistance at [facility] through 2019, as long as eligibility requirements continue to be met.  [facility]

## 2017-07-06 ENCOUNTER — Other Ambulatory Visit: Payer: Self-pay | Admitting: Internal Medicine

## 2017-07-29 ENCOUNTER — Other Ambulatory Visit: Payer: Self-pay | Admitting: Internal Medicine

## 2017-07-29 DIAGNOSIS — Z794 Long term (current) use of insulin: Principal | ICD-10-CM

## 2017-07-29 DIAGNOSIS — E1142 Type 2 diabetes mellitus with diabetic polyneuropathy: Secondary | ICD-10-CM

## 2017-07-30 ENCOUNTER — Telehealth: Payer: Self-pay | Admitting: Pharmacist

## 2017-07-30 NOTE — Telephone Encounter (Signed)
07/30/2017 10:36:37 AM - Refills for Victoza, tips, & Levemir vials  07/30/17 I have received today from [place]- (3) [employer] invoices-First invoice dated 03/13/2017 where we received 2 boxes of Novofine 32G tips, and 4 boxes of Victoza. Second invoice dated 12/26/16 where we received 5 vials of Levemir, and Third invoice dated 03/17/17 where we received 3 vials of Levemir. She is today requesting that I place refill for these meds. I have printed a [employer] refill request for - Levemir Vials Inject 16 units daily at bedtime # 2, and Victoza Inject 1.2mg  once daily # 4 and Novofine 32G tips--[facility].Forde RadonAJ

## 2017-08-07 ENCOUNTER — Telehealth: Payer: Self-pay | Admitting: Pharmacist

## 2017-08-07 NOTE — Telephone Encounter (Signed)
08/07/2017 3:03:26 PM - Ventolin denial letter mailed to pt  08/07/17 I am mailing a denial letter to patient for Ventolin HFA, I mailed patient paperwork to sign & return 05/05/17, I have not received that paperwork back. Copy of letter in chart, and copy to [place] for QS1.Forde RadonAJ

## 2017-08-07 NOTE — Telephone Encounter (Signed)
08/07/2017 11:48:16 AM - refills:Victoza, Tips, Levemir Vials  08/07/17 Faxed [employer] for refills on the following: Victoza Inject 1.2mg  once daily #4, Novofine 32G tips & Levemir Vials Inject 16 units daily at bedtime #2.Forde RadonAJ

## 2017-08-10 ENCOUNTER — Other Ambulatory Visit: Payer: Self-pay | Admitting: Internal Medicine

## 2017-08-10 ENCOUNTER — Other Ambulatory Visit: Payer: Self-pay | Admitting: Adult Health Nurse Practitioner

## 2017-08-20 ENCOUNTER — Ambulatory Visit: Payer: Self-pay | Admitting: Adult Health Nurse Practitioner

## 2017-08-20 VITALS — BP 130/75 | HR 87 | Temp 97.9°F | Wt 194.4 lb

## 2017-08-20 DIAGNOSIS — Z794 Long term (current) use of insulin: Secondary | ICD-10-CM

## 2017-08-20 DIAGNOSIS — E1142 Type 2 diabetes mellitus with diabetic polyneuropathy: Secondary | ICD-10-CM

## 2017-08-20 DIAGNOSIS — I10 Essential (primary) hypertension: Secondary | ICD-10-CM

## 2017-08-20 DIAGNOSIS — E785 Hyperlipidemia, unspecified: Secondary | ICD-10-CM

## 2017-08-20 MED ORDER — SIMVASTATIN 10 MG PO TABS: 10.0000 mg | ORAL_TABLET | Freq: Every day | ORAL | 3 refills | Status: DC

## 2017-08-20 NOTE — Progress Notes (Signed)
Patient: Jorge Olson Male    DOB: Aug 03, 1955   62 y.o.   MRN: [id number] Visit Date: 08/20/2017  Today's Provider: Jacelyn Pi, NP   Chief Complaint  Patient presents with  . Follow-up    back pain and feet are bothering him more, "painful tinggling"   . Back Pain   Subjective:    HPI    Taking victoza 1.2, and levemir 16u QD CBGs averaging 87-169 in the evening.  A1c in Dec was 6.9 Taking medications as directed.  Well controlled at this time.  Pt states that he "eats anything he wants"  Does not exercise due to back/foot pain. -taking gabapentin and it is not as effective as it used to be.   Pt states that he is going to have to go to a specialist after he gets charity care- so he can get approved for his disability.       No Known Allergies Previous Medications   ACETAMINOPHEN (TYLENOL) 650 MG CR TABLET    Take 650 mg by mouth as needed for pain (3 in the morning and 2 in the evening).   ALBUTEROL (PROVENTIL HFA;VENTOLIN HFA) 108 (90 BASE) MCG/ACT INHALER    Inhale 1-2 puffs into the lungs every 6 (six) hours as needed for wheezing or shortness of breath.   ASPIRIN 81 MG TABLET    Take 81 mg by mouth daily.   AZITHROMYCIN (ZITHROMAX) 250 MG TABLET    Take two first day and then one tablet every day after until they are complete.   FEXOFENADINE (ALLEGRA) 180 MG TABLET    Take 1 tablet (180 mg total) by mouth daily.   GABAPENTIN (NEURONTIN) 400 MG CAPSULE    TAKE TWO CAPSULES BY MOUTH 3 TIMES A DAY   GUAIFENESIN (ROBITUSSIN) 100 MG/5ML SOLN    Take 5 mLs (100 mg total) by mouth every 4 (four) hours as needed for cough or to loosen phlegm.   INSULIN DETEMIR (LEVEMIR) 100 UNIT/ML INJECTION    INJECT 16 UNITS UNDER THE SKIN EVERY DAY AT BEDTIME   LIRAGLUTIDE 18 MG/3ML SOPN    Inject 0.3 mLs (1.8 mg total) into the skin every morning.   LISINOPRIL (PRINIVIL,ZESTRIL) 10 MG TABLET    TAKE ONE TABLET BY MOUTH EVERY DAY   METFORMIN (GLUCOPHAGE) 1000 MG TABLET    TAKE 1  TABLET BY MOUTH 2 TIMES A DAY.   NAPROXEN (NAPROSYN) 500 MG TABLET    TAKE 1 TABLET BY MOUTH 2 TIMES A DAY WITH A MEAL.   NORTRIPTYLINE (PAMELOR) 25 MG CAPSULE    TAKE 2 CAPSULES BY MOUTH AT BEDTIME   OMEPRAZOLE (PRILOSEC) 20 MG CAPSULE    TAKE ONE CAPSULE BY MOUTH EVERY DAY   SIMVASTATIN (ZOCOR) 10 MG TABLET    Take 1 tablet (10 mg total) by mouth daily. Reported on 07/31/2015    Review of Systems  All other systems reviewed and are negative.   Social History   Tobacco Use  . Smoking status: Current Every Day Smoker    Packs/day: 1.00    Types: Cigarettes  . Smokeless tobacco: Never Used  . Tobacco comment: Smokes less than a pack a day.  Substance Use Topics  . Alcohol use: No   Objective:   BP 130/75   Pulse 87   Temp 97.9 F (36.6 C)   Wt 194 lb 6.4 oz (88.2 kg)   BMI 26.37 kg/m   Physical Exam  Constitutional: He is oriented to person, place, and  time. He appears well-developed and well-nourished.  Neck: Normal range of motion. Neck supple. No thyromegaly present.  Cardiovascular: Normal rate, regular rhythm, normal heart sounds and intact distal pulses.  Pulmonary/Chest: Effort normal and breath sounds normal.  Abdominal: Bowel sounds are normal.  Neurological: He is alert and oriented to person, place, and time.  Skin: Skin is warm and dry.        Assessment & Plan:     HTN:  Controlled.  Goal BP <140/90.  Continue current medication regimen.  Encourage low salt diet and exercise.   DM:  Controlled.  Encourage diabetic diet and exercise.  Continue current medication regimen.   HLD:  Controlled.   Continue current regimen.  Encourage low cholesterol, low fat diet and exercise.   Foot/Back pain:  Continue Nortriptyline and Gabapentin.  Complete charity car app.   FU in 4 months.            Jacelyn Pieah Doles-Johnson, NP   [facility]

## 2017-08-21 LAB — CBC
HEMATOCRIT: 44.4 % (ref 37.5–51.0)
Hemoglobin: 15.1 g/dL (ref 13.0–17.7)
MCH: 32.5 pg (ref 26.6–33.0)
MCHC: 34 g/dL (ref 31.5–35.7)
MCV: 96 fL (ref 79–97)
PLATELETS: 193 10*3/uL (ref 150–379)
RBC: 4.65 x10E6/uL (ref 4.14–5.80)
RDW: 13.4 % (ref 12.3–15.4)
WBC: 11 10*3/uL — AB (ref 3.4–10.8)

## 2017-08-21 LAB — COMPREHENSIVE METABOLIC PANEL
A/G RATIO: 2.3 — AB (ref 1.2–2.2)
ALBUMIN: 4.3 g/dL (ref 3.5–5.5)
ALK PHOS: 81 IU/L (ref 39–117)
ALT: 28 IU/L (ref 0–44)
AST: 20 IU/L (ref 0–40)
BUN / CREAT RATIO: 12 (ref 9–20)
BUN: 20 mg/dL (ref 6–24)
Bilirubin Total: 0.5 mg/dL (ref 0.0–1.2)
CO2: 29 mmol/L (ref 20–29)
CREATININE: 1.73 mg/dL — AB (ref 0.76–1.27)
Calcium: 9.8 mg/dL (ref 8.7–10.2)
Chloride: 103 mmol/L (ref 96–106)
GFR calc Af Amer: 49 mL/min/{1.73_m2} — ABNORMAL LOW (ref >59)
GFR, EST NON AFRICAN AMERICAN: 42 mL/min/{1.73_m2} — AB (ref >59)
Globulin, Total: 1.9 g/dL (ref 1.5–4.5)
Glucose: 140 mg/dL — ABNORMAL HIGH (ref 65–99)
POTASSIUM: 5.5 mmol/L — AB (ref 3.5–5.2)
SODIUM: 145 mmol/L — AB (ref 134–144)
Total Protein: 6.2 g/dL (ref 6.0–8.5)

## 2017-08-21 LAB — HEMOGLOBIN A1C
ESTIMATED AVERAGE GLUCOSE: 137 mg/dL
Hgb A1c MFr Bld: 6.4 % — ABNORMAL HIGH (ref 4.8–5.6)

## 2017-08-21 LAB — MICROALBUMIN / CREATININE URINE RATIO
Creatinine, Urine: 178.6 mg/dL
MICROALB/CREAT RATIO: 4.1 mg/g{creat} (ref 0.0–30.0)
Microalbumin, Urine: 7.3 ug/mL

## 2017-08-21 LAB — LIPID PANEL
CHOL/HDL RATIO: 2.5 ratio (ref 0.0–5.0)
CHOLESTEROL TOTAL: 106 mg/dL (ref 100–199)
HDL: 43 mg/dL (ref >39)
LDL CALC: 5 mg/dL (ref 0–99)
TRIGLYCERIDES: 288 mg/dL — AB (ref 0–149)
VLDL Cholesterol Cal: 58 mg/dL — ABNORMAL HIGH (ref 5–40)

## 2017-08-21 LAB — TSH: TSH: 1.87 u[IU]/mL (ref 0.450–4.500)

## 2017-08-27 ENCOUNTER — Telehealth: Payer: Self-pay

## 2017-08-27 NOTE — Telephone Encounter (Signed)
Gave lab results to pt.   Doles-Johnson, Teah, NP  Senaida Ores, CMA        Labs are stable, kidney function is slightly worse, will continue to monitor. A1c is improved at 6.4

## 2017-08-27 NOTE — Telephone Encounter (Signed)
-----   Message from Jacelyn Pi, NP sent at 08/25/2017  5:58 PM EDT ----- Labs are stable, kidney function is slightly worse, will continue to monitor. A1c is improved at 6.4.

## 2017-11-06 ENCOUNTER — Other Ambulatory Visit: Payer: Self-pay | Admitting: Internal Medicine

## 2017-11-06 DIAGNOSIS — Z794 Long term (current) use of insulin: Principal | ICD-10-CM

## 2017-11-06 DIAGNOSIS — E1142 Type 2 diabetes mellitus with diabetic polyneuropathy: Secondary | ICD-10-CM

## 2017-12-01 ENCOUNTER — Telehealth: Payer: Self-pay | Admitting: Pharmacist

## 2017-12-01 NOTE — Telephone Encounter (Signed)
12/01/2017 8:55:48 AM - refills-Levemir Vials, Victoza & tips  12/01/17 Received 08/11/17 [place] from [place] requesting to order refills on: Levemir Vials Inject 16 units daily at bedtime # 3, Victoza Pens Inject 1.2mg  every morning #4 & Novofine 32G tips to use daily with Victoza pens. [facility] Candelaria Stagershaplin to sign.Forde RadonAJ

## 2017-12-08 ENCOUNTER — Other Ambulatory Visit: Payer: Self-pay | Admitting: Internal Medicine

## 2017-12-11 ENCOUNTER — Telehealth: Payer: Self-pay | Admitting: Pharmacist

## 2017-12-11 NOTE — Telephone Encounter (Signed)
12/11/2017 9:57:41 AM - Refills-Levemir, Victoza & 32G tips  12/11/17 Faxed [employer] a refill request for: Victoza Inject 1.2mg  every morning # 4, Novofine 32G tips Use with Victoza, Levemir Vials Inject 16 units daily at bedtime # 2.Forde RadonAJ

## 2017-12-20 ENCOUNTER — Other Ambulatory Visit: Payer: Self-pay

## 2017-12-20 ENCOUNTER — Emergency Department
Admission: EM | Admit: 2017-12-20 | Discharge: 2017-12-22 | Disposition: A | Discharge status: Other Acute Inpatient Hospital | Payer: Medicaid Other | Attending: Student in an Organized Health Care Education/Training Program | Admitting: Student in an Organized Health Care Education/Training Program

## 2017-12-20 ENCOUNTER — Encounter: Payer: Self-pay | Admitting: Emergency Medicine

## 2017-12-20 DIAGNOSIS — Z7982 Long term (current) use of aspirin: Secondary | ICD-10-CM | POA: Insufficient documentation

## 2017-12-20 DIAGNOSIS — I10 Essential (primary) hypertension: Secondary | ICD-10-CM | POA: Diagnosis not present

## 2017-12-20 DIAGNOSIS — F419 Anxiety disorder, unspecified: Secondary | ICD-10-CM | POA: Diagnosis not present

## 2017-12-20 DIAGNOSIS — Z794 Long term (current) use of insulin: Secondary | ICD-10-CM | POA: Diagnosis not present

## 2017-12-20 DIAGNOSIS — E119 Type 2 diabetes mellitus without complications: Secondary | ICD-10-CM | POA: Diagnosis not present

## 2017-12-20 DIAGNOSIS — F332 Major depressive disorder, recurrent severe without psychotic features: Secondary | ICD-10-CM | POA: Diagnosis not present

## 2017-12-20 DIAGNOSIS — R45851 Suicidal ideations: Secondary | ICD-10-CM | POA: Insufficient documentation

## 2017-12-20 DIAGNOSIS — F17210 Nicotine dependence, cigarettes, uncomplicated: Secondary | ICD-10-CM | POA: Insufficient documentation

## 2017-12-20 DIAGNOSIS — Z046 Encounter for general psychiatric examination, requested by authority: Secondary | ICD-10-CM | POA: Diagnosis not present

## 2017-12-20 DIAGNOSIS — F329 Major depressive disorder, single episode, unspecified: Secondary | ICD-10-CM | POA: Diagnosis present

## 2017-12-20 DIAGNOSIS — E785 Hyperlipidemia, unspecified: Secondary | ICD-10-CM | POA: Diagnosis present

## 2017-12-20 LAB — COMPREHENSIVE METABOLIC PANEL
ALT: 32 U/L (ref 0–44)
AST: 32 U/L (ref 15–41)
Albumin: 4.6 g/dL (ref 3.5–5.0)
Alkaline Phosphatase: 80 U/L (ref 38–126)
Anion gap: 9 (ref 5–15)
BILIRUBIN TOTAL: 2.4 mg/dL — AB (ref 0.3–1.2)
BUN: 33 mg/dL — AB (ref 6–20)
CO2: 28 mmol/L (ref 22–32)
CREATININE: 1.74 mg/dL — AB (ref 0.61–1.24)
Calcium: 9.3 mg/dL (ref 8.9–10.3)
Chloride: 101 mmol/L (ref 98–111)
GFR calc Af Amer: 47 mL/min — ABNORMAL LOW (ref >60)
GFR, EST NON AFRICAN AMERICAN: 41 mL/min — AB (ref >60)
Glucose, Bld: 200 mg/dL — ABNORMAL HIGH (ref 70–99)
POTASSIUM: 3.8 mmol/L (ref 3.5–5.1)
Sodium: 138 mmol/L (ref 135–145)
TOTAL PROTEIN: 7.3 g/dL (ref 6.5–8.1)

## 2017-12-20 LAB — CBC
HCT: 45.5 % (ref 40.0–52.0)
Hemoglobin: 15.9 g/dL (ref 13.0–18.0)
MCH: 33.5 pg (ref 26.0–34.0)
MCHC: 34.8 g/dL (ref 32.0–36.0)
MCV: 96.2 fL (ref 80.0–100.0)
Platelets: 172 10*3/uL (ref 150–440)
RBC: 4.73 MIL/uL (ref 4.40–5.90)
RDW: 13.1 % (ref 11.5–14.5)
WBC: 10.3 10*3/uL (ref 3.8–10.6)

## 2017-12-20 LAB — ETHANOL

## 2017-12-20 LAB — ACETAMINOPHEN LEVEL: Acetaminophen (Tylenol), Serum: 15 ug/mL (ref 10–30)

## 2017-12-20 LAB — SALICYLATE LEVEL: Salicylate Lvl: <7 mg/dL (ref 2.8–30.0)

## 2017-12-20 NOTE — ED Notes (Signed)
Patients brother Konrad SahaGifford Pingleton  Home- [phone] Cell- [phone]

## 2017-12-20 NOTE — ED Provider Notes (Signed)
[facility] Emergency Department Provider Note    First MD Initiated Contact with Patient 12/20/17 1643     (approximate)  I have reviewed the triage vital signs and the nursing notes.   HISTORY  Chief Complaint Suicidal    HPI Jorge Olson is a 62 y.o. male no significant past medical history presents the ER with chief complaint of feelings of hopelessness and loneliness as well as plan for suicide.  States he was driving on the road today and plan to drive off the road and try to kill himself.  States that he has been under significant stressors recently losing his home and divorce.  Feels worthless.  Denies any previous history of overdose or suicide attempt.    Past Medical History:  Diagnosis Date  . Diabetes mellitus without complication (HCC)   . Hyperlipidemia   . Hypertension 09/04/2016   Family History  Problem Relation Age of Onset  . Diabetes Mellitus II Mother   . Diabetes Mellitus II Father   . Diabetes Mellitus II Sister   . Diabetes Mellitus II Sister    History reviewed. No pertinent surgical history. Patient Active Problem List   Diagnosis Date Noted  . URI (upper respiratory infection) 04/30/2017  . Hypertension 09/04/2016  . Segmental and somatic dysfunction of lumbar region 06/11/2016  . Sciatica, left side 06/11/2016  . Segmental and somatic dysfunction of thoracic region 06/11/2016  . Muscle spasm of back 06/11/2016  . Spinal stenosis 06/05/2016  . Hyperlipidemia 04/19/2015  . Diabetes mellitus without complication (HCC) 12/16/2014      Prior to Admission medications   Medication Sig Start Date End Date Taking? Authorizing Provider  acetaminophen (TYLENOL) 650 MG CR tablet Take 650 mg by mouth as needed for pain (3 in the morning and 2 in the evening).    [provider]  albuterol (PROVENTIL HFA;VENTOLIN HFA) 108 (90 Base) MCG/ACT inhaler Inhale 1-2 puffs into the lungs every 6 (six) hours as needed for  wheezing or shortness of breath. 04/30/17   Doles-Johnson, Teah, NP  aspirin 81 MG tablet Take 81 mg by mouth daily.    [provider]  fexofenadine (ALLEGRA) 180 MG tablet Take 1 tablet (180 mg total) by mouth daily. 12/11/16   McGowan, Carollee Herter A, PA-C  gabapentin (NEURONTIN) 400 MG capsule TAKE TWO CAPSULES BY MOUTH 3 TIMES A DAY 11/06/17   Virl Axe, MD  insulin detemir (LEVEMIR) 100 UNIT/ML injection INJECT 16 UNITS UNDER THE SKIN EVERY DAY AT BEDTIME 12/11/16   McGowan, Carollee Herter A, PA-C  Liraglutide 18 MG/3ML SOPN Inject 0.3 mLs (1.8 mg total) into the skin every morning. 01/24/16 01/23/17  Zachery Dauer, FNP  lisinopril (PRINIVIL,ZESTRIL) 10 MG tablet TAKE ONE TABLET BY MOUTH EVERY DAY 08/10/17   Virl Axe, MD  metFORMIN (GLUCOPHAGE) 1000 MG tablet TAKE 1 TABLET BY MOUTH 2 TIMES A DAY. 12/11/16   McGowan, Carollee Herter A, PA-C  naproxen (NAPROSYN) 500 MG tablet TAKE 1 TABLET BY MOUTH 2 TIMES A DAY WITH A MEAL. 11/06/17   Virl Axe, MD  nortriptyline (PAMELOR) 25 MG capsule TAKE 2 CAPSULES BY MOUTH AT BEDTIME 07/06/17   Virl Axe, MD  omeprazole (PRILOSEC) 20 MG capsule TAKE ONE CAPSULE BY MOUTH EVERY DAY 12/08/17   Virl Axe, MD  simvastatin (ZOCOR) 10 MG tablet Take 1 tablet (10 mg total) by mouth daily. Reported on 07/31/2015 08/20/17   Doles-Johnson, Rulon Sera, NP    Allergies Patient has  no known allergies.    Social History Social History   Tobacco Use  . Smoking status: Current Every Day Smoker    Packs/day: 1.00    Types: Cigarettes  . Smokeless tobacco: Never Used  . Tobacco comment: Smokes less than a pack a day.  Substance Use Topics  . Alcohol use: No  . Drug use: Yes    Types: Methamphetamines    Comment: recently relapsed    Review of Systems Patient denies headaches, rhinorrhea, blurry vision, numbness, shortness of breath, chest pain, edema, cough, abdominal pain, nausea, vomiting, diarrhea, dysuria, fevers, rashes or hallucinations unless otherwise  stated above in HPI. ____________________________________________   PHYSICAL EXAM:  VITAL SIGNS: Vitals:   12/20/17 1602  BP: (!) 152/85  Pulse: 89  Resp: 18  Temp: 97.8 F (36.6 C)  SpO2: 97%    Constitutional: Alert and oriented.  Eyes: Conjunctivae are normal.  Head: Atraumatic. Nose: No congestion/rhinnorhea. Mouth/Throat: Mucous membranes are moist.   Neck: No stridor. Painless ROM.  Cardiovascular: Normal rate, regular rhythm. Grossly normal heart sounds.  Good peripheral circulation. Respiratory: Normal respiratory effort.  No retractions. Lungs CTAB. Gastrointestinal: Soft and nontender. No distention. No abdominal bruits. No CVA tenderness. Genitourinary:  Musculoskeletal: No lower extremity tenderness nor edema.  No joint effusions. Neurologic:  Normal speech and language. No gross focal neurologic deficits are appreciated. No facial droop Skin:  Skin is warm, dry and intact. No rash noted. Psychiatric: Melancholy and withdrawn. ____________________________________________   LABS (all labs ordered are listed, but only abnormal results are displayed)  Results for orders placed or performed during the hospital encounter of 12/20/17 (from the past 24 hour(s))  Comprehensive metabolic panel     Status: Abnormal   Collection Time: 12/20/17  4:23 PM  Result Value Ref Range   Sodium 138 135 - 145 mmol/L   Potassium 3.8 3.5 - 5.1 mmol/L   Chloride 101 98 - 111 mmol/L   CO2 28 22 - 32 mmol/L   Glucose, Bld 200 (H) 70 - 99 mg/dL   BUN 33 (H) 6 - 20 mg/dL   Creatinine, Ser 9.52 (H) 0.61 - 1.24 mg/dL   Calcium 9.3 8.9 - 84.1 mg/dL   Total Protein 7.3 6.5 - 8.1 g/dL   Albumin 4.6 3.5 - 5.0 g/dL   AST 32 15 - 41 U/L   ALT 32 0 - 44 U/L   Alkaline Phosphatase 80 38 - 126 U/L   Total Bilirubin 2.4 (H) 0.3 - 1.2 mg/dL   GFR calc non Af Amer 41 (L) >60 mL/min   GFR calc Af Amer 47 (L) >60 mL/min   Anion gap 9 5 - 15  Ethanol     Status: None   Collection Time:  12/20/17  4:23 PM  Result Value Ref Range   Alcohol, Ethyl (B) <10 <10 mg/dL  cbc     Status: None   Collection Time: 12/20/17  4:23 PM  Result Value Ref Range   WBC 10.3 3.8 - 10.6 K/uL   RBC 4.73 4.40 - 5.90 MIL/uL   Hemoglobin 15.9 13.0 - 18.0 g/dL   HCT 32.4 40.1 - 02.7 %   MCV 96.2 80.0 - 100.0 fL   MCH 33.5 26.0 - 34.0 pg   MCHC 34.8 32.0 - 36.0 g/dL   RDW 25.3 66.4 - 40.3 %   Platelets 172 150 - 440 K/uL   ____________________________________________ ____________________________________________  RADIOLOGY   ____________________________________________   PROCEDURES  Procedure(s) performed:  Procedures  Critical Care performed: no ____________________________________________   INITIAL IMPRESSION / ASSESSMENT AND PLAN / ED COURSE  Pertinent labs & imaging results that were available during my care of the patient were reviewed by me and considered in my medical decision making (see chart for details).   DDX: Psychosis, delirium, medication effect, noncompliance, polysubstance abuse, Si, Hi, depression   Jorge Olson is a 62 y.o. who presents to the ED with for evaluation of SI.   Laboratory testing was ordered to evaluation for underlying electrolyte derangement or signs of underlying organic pathology to explain today's presentation.  Based on history and physical and laboratory evaluation, it appears that the patient's presentation is 2/2 underlying psychiatric disorder and will require further evaluation and management by inpatient psychiatry.  Patient was  made an IVC due to SI with plan.  Disposition pending psychiatric evaluation.       As part of my medical decision making, I reviewed the following data within the electronic MEDICAL RECORD NUMBER Nursing notes reviewed and incorporated, Labs reviewed, notes from prior ED visits.   ____________________________________________   FINAL CLINICAL IMPRESSION(S) / ED DIAGNOSES  Final diagnoses:  Suicidal  ideation      NEW MEDICATIONS STARTED DURING THIS VISIT:  New Prescriptions   No medications on file     Note:  This document was prepared using Dragon voice recognition software and may include unintentional dictation errors.    Willy Eddyobinson, Nikeria Kalman, MD 12/20/17 [phone]

## 2017-12-20 NOTE — ED Notes (Signed)
Items collected from pt and placed in belongings bag:  Tennis shoes Socks IT sales professionalWallet Comb Change  Phone charger Phone Shirt Black belt Denim shorts White underwear
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
## 2017-12-20 NOTE — ED Notes (Signed)
Gave pt sandwich tray and drink. 

## 2017-12-21 DIAGNOSIS — F332 Major depressive disorder, recurrent severe without psychotic features: Secondary | ICD-10-CM

## 2017-12-21 DIAGNOSIS — R45851 Suicidal ideations: Secondary | ICD-10-CM

## 2017-12-21 LAB — GLUCOSE, CAPILLARY
Glucose-Capillary: 192 mg/dL — ABNORMAL HIGH (ref 70–99)
Glucose-Capillary: 160 mg/dL — ABNORMAL HIGH (ref 70–99)

## 2017-12-21 MED ORDER — METFORMIN HCL 500 MG PO TABS
1000.0000 mg | ORAL_TABLET | Freq: Two times a day (BID) | ORAL | Status: DC
  Administered 2017-12-21 – 2017-12-22 (×2): 1000 mg via ORAL
  Filled 2017-12-21 (×2): qty 2

## 2017-12-21 MED ORDER — INSULIN DETEMIR 100 UNIT/ML ~~LOC~~ SOLN
16.0000 [IU] | Freq: Every day | SUBCUTANEOUS | Status: DC
  Administered 2017-12-21: 16 [IU] via SUBCUTANEOUS
  Filled 2017-12-21 (×2): qty 0.16

## 2017-12-21 MED ORDER — GABAPENTIN 300 MG PO CAPS
300.0000 mg | ORAL_CAPSULE | Freq: Three times a day (TID) | ORAL | Status: DC
  Administered 2017-12-21: 300 mg via ORAL
  Filled 2017-12-21: qty 1

## 2017-12-21 MED ORDER — ALBUTEROL SULFATE HFA 108 (90 BASE) MCG/ACT IN AERS
1.0000 | INHALATION_SPRAY | Freq: Four times a day (QID) | RESPIRATORY_TRACT | Status: DC | PRN
  Filled 2017-12-21: qty 6.7

## 2017-12-21 MED ORDER — INSULIN ASPART 100 UNIT/ML ~~LOC~~ SOLN
0.0000 [IU] | Freq: Three times a day (TID) | SUBCUTANEOUS | Status: DC
  Administered 2017-12-21: 3 [IU] via SUBCUTANEOUS
  Administered 2017-12-22 (×2): 2 [IU] via SUBCUTANEOUS
  Filled 2017-12-21 (×3): qty 1

## 2017-12-21 MED ORDER — SIMVASTATIN 10 MG PO TABS
10.0000 mg | ORAL_TABLET | Freq: Every day | ORAL | Status: DC
  Administered 2017-12-21 – 2017-12-22 (×2): 10 mg via ORAL
  Filled 2017-12-21 (×2): qty 1

## 2017-12-21 MED ORDER — FLUOXETINE HCL 20 MG PO CAPS
20.0000 mg | ORAL_CAPSULE | Freq: Every day | ORAL | Status: DC
  Administered 2017-12-21 – 2017-12-22 (×2): 20 mg via ORAL
  Filled 2017-12-21 (×2): qty 1

## 2017-12-21 MED ORDER — INSULIN DETEMIR 100 UNIT/ML ~~LOC~~ SOLN
16.0000 [IU] | Freq: Every day | SUBCUTANEOUS | Status: DC
  Filled 2017-12-21 (×2): qty 0.16

## 2017-12-21 MED ORDER — ASPIRIN EC 81 MG PO TBEC
81.0000 mg | DELAYED_RELEASE_TABLET | Freq: Every day | ORAL | Status: DC
  Administered 2017-12-21 – 2017-12-22 (×2): 81 mg via ORAL
  Filled 2017-12-21 (×2): qty 1

## 2017-12-21 MED ORDER — GABAPENTIN 600 MG PO TABS
1200.0000 mg | ORAL_TABLET | Freq: Two times a day (BID) | ORAL | Status: DC
  Administered 2017-12-21 – 2017-12-22 (×2): 1200 mg via ORAL
  Filled 2017-12-21 (×2): qty 2
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
## 2017-12-21 NOTE — ED Notes (Signed)
IVC/  PENDING  PLACEMENT 

## 2017-12-21 NOTE — ED Notes (Signed)
TTS at beside.  

## 2017-12-21 NOTE — ED Notes (Signed)
Hourly rounding reveals patient sleeping in room. No complaints, stable, in no acute distress. Q15 minute rounds and monitoring via Security to continue. 
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
## 2017-12-22 ENCOUNTER — Inpatient Hospital Stay
Admission: AD | Admit: 2017-12-22 | Discharge: 2017-12-28 | DRG: 885 | Disposition: A | Discharge status: Home / Self Care | Payer: No Typology Code available for payment source | Attending: Psychiatry | Admitting: Psychiatry

## 2017-12-22 ENCOUNTER — Other Ambulatory Visit: Payer: Self-pay

## 2017-12-22 ENCOUNTER — Ambulatory Visit: Payer: Self-pay

## 2017-12-22 DIAGNOSIS — E785 Hyperlipidemia, unspecified: Secondary | ICD-10-CM | POA: Diagnosis present

## 2017-12-22 DIAGNOSIS — Z79899 Other long term (current) drug therapy: Secondary | ICD-10-CM | POA: Diagnosis not present

## 2017-12-22 DIAGNOSIS — E1140 Type 2 diabetes mellitus with diabetic neuropathy, unspecified: Secondary | ICD-10-CM | POA: Diagnosis present

## 2017-12-22 DIAGNOSIS — M9903 Segmental and somatic dysfunction of lumbar region: Secondary | ICD-10-CM | POA: Diagnosis present

## 2017-12-22 DIAGNOSIS — F332 Major depressive disorder, recurrent severe without psychotic features: Secondary | ICD-10-CM

## 2017-12-22 DIAGNOSIS — F17210 Nicotine dependence, cigarettes, uncomplicated: Secondary | ICD-10-CM | POA: Diagnosis present

## 2017-12-22 DIAGNOSIS — Z833 Family history of diabetes mellitus: Secondary | ICD-10-CM

## 2017-12-22 DIAGNOSIS — G8929 Other chronic pain: Secondary | ICD-10-CM | POA: Diagnosis present

## 2017-12-22 DIAGNOSIS — E1142 Type 2 diabetes mellitus with diabetic polyneuropathy: Secondary | ICD-10-CM

## 2017-12-22 DIAGNOSIS — M5432 Sciatica, left side: Secondary | ICD-10-CM | POA: Diagnosis present

## 2017-12-22 DIAGNOSIS — F329 Major depressive disorder, single episode, unspecified: Secondary | ICD-10-CM | POA: Diagnosis present

## 2017-12-22 DIAGNOSIS — Z5181 Encounter for therapeutic drug level monitoring: Secondary | ICD-10-CM | POA: Diagnosis not present

## 2017-12-22 DIAGNOSIS — I10 Essential (primary) hypertension: Secondary | ICD-10-CM | POA: Diagnosis present

## 2017-12-22 DIAGNOSIS — G4700 Insomnia, unspecified: Secondary | ICD-10-CM | POA: Diagnosis present

## 2017-12-22 DIAGNOSIS — R45851 Suicidal ideations: Secondary | ICD-10-CM | POA: Diagnosis present

## 2017-12-22 DIAGNOSIS — Z794 Long term (current) use of insulin: Secondary | ICD-10-CM | POA: Diagnosis not present

## 2017-12-22 DIAGNOSIS — Z7982 Long term (current) use of aspirin: Secondary | ICD-10-CM | POA: Diagnosis not present

## 2017-12-22 DIAGNOSIS — F419 Anxiety disorder, unspecified: Secondary | ICD-10-CM | POA: Diagnosis present

## 2017-12-22 LAB — GLUCOSE, CAPILLARY
GLUCOSE-CAPILLARY: 129 mg/dL — AB (ref 70–99)
GLUCOSE-CAPILLARY: 152 mg/dL — AB (ref 70–99)
Glucose-Capillary: 141 mg/dL — ABNORMAL HIGH (ref 70–99)

## 2017-12-22 MED ORDER — ACETAMINOPHEN 325 MG PO TABS
650.0000 mg | ORAL_TABLET | Freq: Four times a day (QID) | ORAL | Status: DC | PRN
  Administered 2017-12-22 – 2017-12-27 (×3): 650 mg via ORAL
  Filled 2017-12-22 (×4): qty 2

## 2017-12-22 MED ORDER — INSULIN ASPART 100 UNIT/ML ~~LOC~~ SOLN
0.0000 [IU] | Freq: Three times a day (TID) | SUBCUTANEOUS | Status: DC
  Administered 2017-12-22: 3 [IU] via SUBCUTANEOUS
  Administered 2017-12-23: 2 [IU] via SUBCUTANEOUS
  Administered 2017-12-23: 3 [IU] via SUBCUTANEOUS
  Administered 2017-12-23: 2 [IU] via SUBCUTANEOUS
  Administered 2017-12-24: 8 [IU] via SUBCUTANEOUS
  Administered 2017-12-24: 2 [IU] via SUBCUTANEOUS
  Administered 2017-12-25: 5 [IU] via SUBCUTANEOUS
  Administered 2017-12-25: 3 [IU] via SUBCUTANEOUS
  Administered 2017-12-25 – 2017-12-26 (×2): 2 [IU] via SUBCUTANEOUS
  Administered 2017-12-26 – 2017-12-27 (×3): 3 [IU] via SUBCUTANEOUS
  Administered 2017-12-27: 2 [IU] via SUBCUTANEOUS
  Administered 2017-12-27 – 2017-12-28 (×2): 5 [IU] via SUBCUTANEOUS
  Administered 2017-12-28: 3 [IU] via SUBCUTANEOUS
  Filled 2017-12-22 (×7): qty 1

## 2017-12-22 MED ORDER — ALUM & MAG HYDROXIDE-SIMETH 200-200-20 MG/5ML PO SUSP
30.0000 mL | ORAL | Status: DC | PRN
  Administered 2017-12-24: 30 mL via ORAL
  Filled 2017-12-22: qty 30

## 2017-12-22 MED ORDER — INSULIN DETEMIR 100 UNIT/ML ~~LOC~~ SOLN: 16.0000 [IU] | Freq: Every day | SUBCUTANEOUS | Status: DC

## 2017-12-22 MED ORDER — ALBUTEROL SULFATE HFA 108 (90 BASE) MCG/ACT IN AERS
1.0000 | INHALATION_SPRAY | Freq: Four times a day (QID) | RESPIRATORY_TRACT | Status: DC | PRN
  Filled 2017-12-22: qty 6.7

## 2017-12-22 MED ORDER — LORAZEPAM 1 MG PO TABS
1.0000 mg | ORAL_TABLET | ORAL | Status: DC | PRN
  Administered 2017-12-22: 1 mg via ORAL
  Filled 2017-12-22: qty 1

## 2017-12-22 MED ORDER — INSULIN DETEMIR 100 UNIT/ML ~~LOC~~ SOLN
16.0000 [IU] | Freq: Every day | SUBCUTANEOUS | Status: DC
  Administered 2017-12-22 – 2017-12-27 (×6): 16 [IU] via SUBCUTANEOUS
  Filled 2017-12-22 (×8): qty 0.16

## 2017-12-22 MED ORDER — HYDROXYZINE HCL 50 MG PO TABS
50.0000 mg | ORAL_TABLET | ORAL | Status: DC | PRN
  Administered 2017-12-23: 50 mg via ORAL
  Filled 2017-12-22: qty 1

## 2017-12-22 MED ORDER — ASPIRIN EC 81 MG PO TBEC
81.0000 mg | DELAYED_RELEASE_TABLET | Freq: Every day | ORAL | Status: DC
  Administered 2017-12-22 – 2017-12-28 (×7): 81 mg via ORAL
  Filled 2017-12-22 (×7): qty 1

## 2017-12-22 MED ORDER — FLUOXETINE HCL 20 MG PO CAPS
20.0000 mg | ORAL_CAPSULE | Freq: Every day | ORAL | Status: DC
  Administered 2017-12-22 – 2017-12-28 (×7): 20 mg via ORAL
  Filled 2017-12-22 (×7): qty 1

## 2017-12-22 MED ORDER — METFORMIN HCL 500 MG PO TABS
1000.0000 mg | ORAL_TABLET | Freq: Two times a day (BID) | ORAL | Status: DC
  Administered 2017-12-22 – 2017-12-28 (×12): 1000 mg via ORAL
  Filled 2017-12-22 (×13): qty 2

## 2017-12-22 MED ORDER — SIMVASTATIN 10 MG PO TABS
10.0000 mg | ORAL_TABLET | Freq: Every day | ORAL | Status: DC
  Administered 2017-12-22 – 2017-12-28 (×7): 10 mg via ORAL
  Filled 2017-12-22 (×8): qty 1

## 2017-12-22 MED ORDER — GABAPENTIN 600 MG PO TABS
1200.0000 mg | ORAL_TABLET | Freq: Two times a day (BID) | ORAL | Status: DC
  Administered 2017-12-22 – 2017-12-28 (×12): 1200 mg via ORAL
  Filled 2017-12-22 (×13): qty 2

## 2017-12-22 MED ORDER — MAGNESIUM HYDROXIDE 400 MG/5ML PO SUSP: 30.0000 mL | Freq: Every day | ORAL | Status: DC | PRN

## 2017-12-22 MED ORDER — ACETAMINOPHEN 500 MG PO TABS
1000.0000 mg | ORAL_TABLET | Freq: Two times a day (BID) | ORAL | Status: DC | PRN
  Administered 2017-12-22: 1000 mg via ORAL
  Filled 2017-12-22: qty 2

## 2017-12-22 NOTE — Plan of Care (Signed)
  Problem: Education: Goal: Knowledge of [facility] Education information/materials will improve Outcome: Progressing Note:  Instructions given to patient on [place] , patient able to verbalize  understanding of information received

## 2017-12-22 NOTE — ED Provider Notes (Signed)
-----------------------------------------   7:27 AM on 12/22/2017 -----------------------------------------   Blood pressure 137/77, pulse 74, temperature 97.8 F (36.6 C), temperature source Oral, resp. rate 20, height 6' (1.829 m), weight 82.1 kg, SpO2 100 %.  The patient had no acute events since last update.  Calm and cooperative at this time.  [facility]  He is awaiting acceptance.     Rebecka ApleyWebster, Azie Mcconahy P, MD 12/22/17 [phone]

## 2017-12-22 NOTE — [facility] Group Notes (Signed)
[facility] Group Notes:  (Nursing/MHT/Case Management/Adjunct)  Date:  12/22/2017  Time:  9:27 PM  Type of Therapy:  Group Therapy  Participation Level:  Did Not Attend   Jorge Olson 12/22/2017, 9:27 PM
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
## 2017-12-22 NOTE — Tx Team (Signed)
Initial Treatment Plan 12/22/2017 3:54 PM Jorge Olson [id number]    PATIENT STRESSORS: Financial difficulties Health problems Legal issue   PATIENT STRENGTHS: Ability for insight Average or above average intelligence Capable of independent living Communication skills Supportive family/friends   PATIENT IDENTIFIED PROBLEMS: Depression 12/22/17  Medical Concerns  12/22/17   Homeless  12/22/17  Chronic Pain 12/22/17               DISCHARGE CRITERIA:  Ability to meet basic life and health needs Improved stabilization in mood, thinking, and/or behavior Medical problems require only outpatient monitoring  PRELIMINARY DISCHARGE PLAN: Outpatient therapy Return to previous living arrangement  PATIENT/FAMILY INVOLVEMENT: This treatment plan has been presented to and reviewed with the patient, Jorge BattiestBilly E Olson, and/or family member,  .  The patient and family have been given the opportunity to ask questions and make suggestions.  Crist InfanteGwen A Travanti Mcmanus, RN 12/22/2017, 3:54 PM

## 2017-12-22 NOTE — ED Notes (Signed)
TTS [facility] Illene SilverBrook[facility] at 10:45 p.m., regarding inpatient placement.

## 2017-12-22 NOTE — ED Notes (Signed)
While given patient his 1000 am medications, patient asked for "something to help me with my nerves", "im feeling anxious and I cant stop picking my skin". Patient has tiny pin dots on arms where he picks at his skin.  Patient given Ativan 1mg 

## 2017-12-22 NOTE — ED Notes (Signed)
Hourly rounding reveals patient sleeping in room. No complaints, stable, in no acute distress. Q15 minute rounds and monitoring via Security Cameras to continue. 

## 2017-12-22 NOTE — ED Notes (Signed)
Patient given Tylenol 1, 000mg  two times a day, for back pain, patient says it feels like his back is stiff, and it is hard for him to move, he has also had some issues with lumbar disks per patient.

## 2017-12-22 NOTE — BH Assessment (Signed)
[facility] by Dr. Toni Amendlapacs.  Attending Physician will be Dr. Johnella MoloneyMcNew.   Patient has been [facility] to room 324[facility]B, [facility] Gwen.   Intake Paper Work has been signed and placed on patient chart.  ER staff is aware of the admission:  [facility] Secretary   Dr. Cyril LoosenKinner, ER MD   [facility] Patient's Nurse   Ethelene BrownsAnthony Patient Access.

## 2017-12-22 NOTE — ED Notes (Signed)
Patient visiting with sister  

## 2017-12-22 NOTE — ED Notes (Signed)
Patient given phone and his hospital code, so that he is able to get in contact with niece and brother

## 2017-12-23 ENCOUNTER — Other Ambulatory Visit: Payer: Self-pay

## 2017-12-23 DIAGNOSIS — F332 Major depressive disorder, recurrent severe without psychotic features: Principal | ICD-10-CM

## 2017-12-23 LAB — URINE DRUG SCREEN, QUALITATIVE (ARMC ONLY)
Amphetamines, Ur Screen: POSITIVE — AB
Barbiturates, Ur Screen: NOT DETECTED
COCAINE METABOLITE, UR ~~LOC~~: NOT DETECTED
Cannabinoid 50 Ng, Ur ~~LOC~~: NOT DETECTED
MDMA (ECSTASY) UR SCREEN: NOT DETECTED
Methadone Scn, Ur: NOT DETECTED
Opiate, Ur Screen: NOT DETECTED
Phencyclidine (PCP) Ur S: NOT DETECTED
TRICYCLIC, UR SCREEN: NOT DETECTED

## 2017-12-23 LAB — GLUCOSE, CAPILLARY
GLUCOSE-CAPILLARY: 138 mg/dL — AB (ref 70–99)
Glucose-Capillary: 136 mg/dL — ABNORMAL HIGH (ref 70–99)
Glucose-Capillary: 151 mg/dL — ABNORMAL HIGH (ref 70–99)
Glucose-Capillary: 190 mg/dL — ABNORMAL HIGH (ref 70–99)
Glucose-Capillary: 135 mg/dL — ABNORMAL HIGH (ref 70–99)
Glucose-Capillary: 99 mg/dL (ref 70–99)

## 2017-12-23 LAB — HEMOGLOBIN A1C
Hgb A1c MFr Bld: 6.6 % — ABNORMAL HIGH (ref 4.8–5.6)
MEAN PLASMA GLUCOSE: 142.72 mg/dL

## 2017-12-23 LAB — LIPID PANEL
CHOL/HDL RATIO: 2.3 ratio
CHOLESTEROL: 91 mg/dL (ref 0–200)
HDL: 40 mg/dL — AB (ref >40)
LDL Cholesterol: 30 mg/dL (ref 0–99)
Triglycerides: 107 mg/dL (ref <150)
VLDL: 21 mg/dL (ref 0–40)

## 2017-12-23 LAB — TSH: TSH: 0.65 u[IU]/mL (ref 0.350–4.500)

## 2017-12-23 MED ORDER — BENZTROPINE MESYLATE 1 MG PO TABS: 1.0000 mg | ORAL_TABLET | Freq: Two times a day (BID) | ORAL | Status: DC

## 2017-12-23 MED ORDER — TRAZODONE HCL 50 MG PO TABS: 50.0000 mg | ORAL_TABLET | Freq: Every evening | ORAL | Status: DC | PRN

## 2017-12-23 NOTE — Plan of Care (Signed)
Patient is alert and oriented to self, place and time. Denies having any thoughts wanting to harm himself states, "I'm just a little hopeless right now and I'm experiencing some depression but no, I don't want to hurt myself." Patient present in the milieu for meals and medications. Observed in the dayroom interacting appropriately with peers. Did not attend group or go outside today. Milieu remains safe with q 15 minute safety checks. Will continue to monitor.

## 2017-12-23 NOTE — Progress Notes (Signed)
Recreation Therapy Notes  Date: 12/24/2017  Time: 9:30 am   Location: Craft Room   Behavioral response: N/A   Intervention Topic: Emotions  Discussion/Intervention: Patient did not attend group.   Clinical Observations/Feedback:  Patient did not attend group.   Shawne Eskelson LRT/CTRS        Ezrah Dembeck 12/23/2017 10:21 AM

## 2017-12-23 NOTE — [facility] Suicide Risk Assessment (Signed)
[facility]   Nursing information obtained from:  Patient Demographic factors:  Male, Caucasian, Low socioeconomic status, Unemployed, Living alone Current Mental Status:  NA Loss Factors:  Loss of significant relationship, Decline in physical health, Financial problems / change in socioeconomic status Historical Factors:  NA Risk Reduction Factors:  Living with another person, especially a relative  Total Time spent with patient: 45 minutes Principal Problem: Major depressive disorder, recurrent episode, severe (HCC) Diagnosis:   Patient Active Problem List   Diagnosis Date Noted  . Major depressive disorder, recurrent episode, severe (HCC) [F33.2] 12/21/2017    Priority: High  . Suicidal ideation [R45.851] 12/21/2017  . URI (upper respiratory infection) [J06.9] 04/30/2017  . Hypertension [I10] 09/04/2016  . Segmental and somatic dysfunction of lumbar region [M99.03] 06/11/2016  . Sciatica, left side [M54.32] 06/11/2016  . Segmental and somatic dysfunction of thoracic region [M99.02] 06/11/2016  . Muscle spasm of back [M62.830] 06/11/2016  . Spinal stenosis [M48.00] 06/05/2016  . Hyperlipidemia [E78.5] 04/19/2015  . Diabetes mellitus without complication (HCC) [E11.9] 12/16/2014   Subjective Data: See H&P  Continued Clinical Symptoms:  Alcohol Use Disorder Identification Test Final Score (AUDIT): 0 The "Alcohol Use Disorders Identification Test", Guidelines for Use in Primary Care, Second Edition.  World Science writerHealth Organization [facility](WHO). Score between 0-7:  no or low risk or alcohol related problems. Score between 8-15:  moderate risk of alcohol related problems. Score between 16-19:  high risk of alcohol related problems. Score 20 or above:  warrants further diagnostic evaluation for alcohol dependence and treatment.   CLINICAL FACTORS:   Depression:   Hopelessness   COGNITIVE FEATURES THAT CONTRIBUTE TO RISK:  None    SUICIDE RISK:   Moderate:   Frequent suicidal ideation with limited intensity, and duration, some specificity in terms of plans, no associated intent, good self-control, limited dysphoria/symptomatology, some risk factors present, and identifiable protective factors, including available and accessible social support.  PLAN OF CARE: See H&P  I certify that inpatient services furnished can reasonably be expected to improve the patient's condition.   Haskell RilingHolly R Clearance Chenault, MD 12/23/2017, 3:02 PM

## 2017-12-23 NOTE — Progress Notes (Signed)
Patient stayed in his room for the majority of the shift. Went to the dayroom, attended group and returned to his room. Expressing helplessness and hopelessness.Denying thoughts of self harm. Denying hallucinations. Had medications and snack and went to bed, reporting that he was feeling tired. Currently sleeping and appears to be comfortable in bed. Safety precautions maintained.
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
## 2017-12-23 NOTE — Progress Notes (Signed)
Recreation Therapy Notes  INPATIENT RECREATION THERAPY ASSESSMENT  Patient Details Name: Jorge Olson MRN: [id number] DOB: 11/12/1955 Today's Date: 12/23/2017       Information Obtained From: (Patient refused)  Able to Participate in Assessment/Interview:    Patient Presentation:    Reason for Admission (Per Patient):    Patient Stressors:    Coping Skills:      Leisure Interests (2+):     Frequency of Recreation/Participation:    Awareness of Community Resources:     WalgreenCommunity Resources:     Current Use:    If no, Barriers?:    Expressed Interest in State Street CorporationCommunity Resource Information:    [place]     Patient Main Form of Transportation:    Patient Strengths:     Patient Identified Areas of Improvement:     Patient Goal for Hospitalization:     Current SI (including self-harm):     Current HI:     Current AVH:    Staff Intervention Plan:    Consent to Intern Participation:    Jorge Olson 12/23/2017, 12:44 PM

## 2017-12-23 NOTE — Plan of Care (Signed)
Newly admitted. Sad and depressed but compliant with treatment

## 2017-12-23 NOTE — Tx Team (Addendum)
Interdisciplinary Treatment and Diagnostic Plan Update  12/23/2017 Time of Session: 11:00am Jorge BattiestBilly E Olson MRN: [id number]  Principal Diagnosis: Major depressive disorder, recurrent episode, severe (HCC)  Secondary Diagnoses: Principal Problem:   Major depressive disorder, recurrent episode, severe (HCC)   Current Medications:  Current Facility-Administered Medications  Medication Dose Route Frequency Provider Last Rate Last Dose  . acetaminophen (TYLENOL) tablet 650 mg  650 mg Oral Q6H PRN Clapacs, Jackquline DenmarkJohn T, MD   650 mg at 12/22/17 2153  . albuterol (PROVENTIL HFA;VENTOLIN HFA) 108 (90 Base) MCG/ACT inhaler 1-2 puff  1-2 puff Inhalation Q6H PRN Clapacs, John T, MD      . alum & mag hydroxide-simeth (MAALOX/MYLANTA) 200-200-20 MG/5ML suspension 30 mL  30 mL Oral Q4H PRN Clapacs, John T, MD      . aspirin EC tablet 81 mg  81 mg Oral Daily Clapacs, Jackquline DenmarkJohn T, MD   81 mg at 12/23/17 0810  . FLUoxetine (PROZAC) capsule 20 mg  20 mg Oral Daily Clapacs, Jackquline DenmarkJohn T, MD   20 mg at 12/23/17 0810  . gabapentin (NEURONTIN) tablet 1,200 mg  1,200 mg Oral BID Clapacs, John T, MD   1,200 mg at 12/23/17 1627  . hydrOXYzine (ATARAX/VISTARIL) tablet 50 mg  50 mg Oral Q4H PRN Haskell RilingMcNew, Holly R, MD   50 mg at 12/23/17 1636  . insulin aspart (novoLOG) injection 0-15 Units  0-15 Units Subcutaneous TID WC Clapacs, Jackquline DenmarkJohn T, MD   2 Units at 12/23/17 1627  . insulin detemir (LEVEMIR) injection 16 Units  16 Units Subcutaneous QHS Clapacs, Jackquline DenmarkJohn T, MD   16 Units at 12/22/17 2256  . magnesium hydroxide (MILK OF MAGNESIA) suspension 30 mL  30 mL Oral Daily PRN Clapacs, John T, MD      . metFORMIN (GLUCOPHAGE) tablet 1,000 mg  1,000 mg Oral BID WC Clapacs, John T, MD   1,000 mg at 12/23/17 1627  . simvastatin (ZOCOR) tablet 10 mg  10 mg Oral Daily Clapacs, Jackquline DenmarkJohn T, MD   10 mg at 12/23/17 0811  . traZODone (DESYREL) tablet 50 mg  50 mg Oral QHS PRN McNew, Ileene HutchinsonHolly R, MD       PTA Medications: Facility-Administered Medications Prior to  Admission  Medication Dose Route Frequency Provider Last Rate Last Dose  . ondansetron (ZOFRAN-ODT) disintegrating tablet 4 mg  4 mg Oral Q8H PRN Zachery Dauerdem, Donna S, FNP       Medications Prior to Admission  Medication Sig Dispense Refill Last Dose  . acetaminophen (TYLENOL) 650 MG CR tablet as needed. Take 3 tablets (1950MG ) by mouth every morning and 2 tablets (1300MG ) by mouth every evening   Taking  . albuterol (PROVENTIL HFA;VENTOLIN HFA) 108 (90 Base) MCG/ACT inhaler Inhale 1-2 puffs into the lungs every 6 (six) hours as needed for wheezing or shortness of breath. 1 Inhaler 3 PRN at PRN  . aspirin 81 MG tablet Take 81 mg by mouth daily.   Taking  . fexofenadine (ALLEGRA) 180 MG tablet Take 1 tablet (180 mg total) by mouth daily. 90 tablet 1 Taking  . gabapentin (NEURONTIN) 400 MG capsule TAKE TWO CAPSULES BY MOUTH 3 TIMES A DAY (Patient taking differently: Take 3 capsules (1200MG ) by mouth twice daily) 180 capsule 0   . insulin detemir (LEVEMIR) 100 UNIT/ML injection INJECT 16 UNITS UNDER THE SKIN EVERY DAY AT BEDTIME 20 mL 2 Taking  . liraglutide (VICTOZA) 18 MG/3ML SOPN Inject 1.2 mg into the skin daily.     Marland Kitchen. lisinopril (PRINIVIL,ZESTRIL) 10 MG  tablet TAKE ONE TABLET BY MOUTH EVERY DAY (Patient not taking: Reported on 12/21/2017) 30 tablet 0 Not Taking at Unknown time  . metFORMIN (GLUCOPHAGE) 1000 MG tablet TAKE 1 TABLET BY MOUTH 2 TIMES A DAY. 180 tablet PRN Taking  . naproxen (NAPROSYN) 500 MG tablet TAKE 1 TABLET BY MOUTH 2 TIMES A DAY WITH A MEAL. (Patient taking differently: Take 500 mg by mouth 2 (two) times daily. ) 60 tablet 0   . nortriptyline (PAMELOR) 25 MG capsule TAKE 2 CAPSULES BY MOUTH AT BEDTIME (Patient not taking: Reported on 12/21/2017) 60 capsule 0 Not Taking at Unknown time  . omeprazole (PRILOSEC) 20 MG capsule TAKE ONE CAPSULE BY MOUTH EVERY DAY 30 capsule 0   . simvastatin (ZOCOR) 10 MG tablet Take 1 tablet (10 mg total) by mouth daily. Reported on 07/31/2015 30 tablet 3      Patient Stressors: Financial difficulties Health problems Legal issue  Patient Strengths: Ability for insight Average or above average intelligence Capable of independent living Communication skills Supportive family/friends  Treatment Modalities: Medication Management, Group therapy, Case management,  1 to 1 session with clinician, Psychoeducation, Recreational therapy.   Physician Treatment Plan for Primary Diagnosis: Major depressive disorder, recurrent episode, severe (HCC) Long Term Goal(s): Improvement in symptoms so as ready for discharge   Short Term Goals: Ability to disclose and discuss suicidal ideas  Medication Management: Evaluate patient's response, side effects, and tolerance of medication regimen.  Therapeutic Interventions: 1 to 1 sessions, Unit Group sessions and Medication administration.  Evaluation of Outcomes: Progressing  Physician Treatment Plan for Secondary Diagnosis: Principal Problem:   Major depressive disorder, recurrent episode, severe (HCC)  Long Term Goal(s): Improvement in symptoms so as ready for discharge   Short Term Goals: Ability to disclose and discuss suicidal ideas     Medication Management: Evaluate patient's response, side effects, and tolerance of medication regimen.  Therapeutic Interventions: 1 to 1 sessions, Unit Group sessions and Medication administration.  Evaluation of Outcomes: Progressing   RN Treatment Plan for Primary Diagnosis: Major depressive disorder, recurrent episode, severe (HCC) Long Term Goal(s): Knowledge of disease and therapeutic regimen to maintain health will improve  Short Term Goals: Ability to identify and develop effective coping behaviors will improve and Compliance with prescribed medications will improve  Medication Management: RN will administer medications as ordered by provider, will assess and evaluate patient's response and provide education to patient for prescribed medication. RN will  report any adverse and/or side effects to prescribing provider.  Therapeutic Interventions: 1 on 1 counseling sessions, Psychoeducation, Medication administration, Evaluate responses to treatment, Monitor vital signs and CBGs as ordered, Perform/monitor CIWA, COWS, AIMS and Fall Risk screenings as ordered, Perform wound care treatments as ordered.  Evaluation of Outcomes: Progressing   LCSW Treatment Plan for Primary Diagnosis: Major depressive disorder, recurrent episode, severe (HCC) Long Term Goal(s): Safe transition to appropriate next level of care at discharge, Engage patient in therapeutic group addressing interpersonal concerns.  Short Term Goals: Engage patient in aftercare planning with referrals and resources, Identify triggers associated with mental health/substance abuse issues and Increase skills for wellness and recovery  Therapeutic Interventions: Assess for all discharge needs, 1 to 1 time with Social worker, Explore available resources and support systems, Assess for adequacy in community support network, Educate family and significant other(s) on suicide prevention, Complete Psychosocial Assessment, Interpersonal group therapy.  Evaluation of Outcomes: Progressing   Progress in Treatment: Attending groups: Yes. Participating in groups: Yes. Taking medication as prescribed: Yes.  Toleration medication: Yes. Family/Significant other contact made: No, will contact:     Patient understands diagnosis: Yes. Discussing patient identified problems/goals with staff: Yes. Medical problems stabilized or resolved: Yes. Denies suicidal/homicidal ideation: Yes. Issues/concerns per patient self-inventory: No. Other:    New problem(s) identified: No, Describe:     New Short Term/Long Term Goal(s):  Patient Goals:  To get on medications and be discharged  Discharge Plan or Barriers: Discharge home to brother and follow up with outpatient med management TBD  Reason for  Continuation of Hospitalization: Depression Medication stabilization  Estimated Length of Stay: 5-7 days  Recreational Therapy: Patient Stressors: N/A Patient Goal: Patient will engage in groups with a calm and appropriate mood at least 2x within 5 recreation therapy group sessions  Attendees: Patient:Jorge Olson 12/23/2017 4:53 PM  Physician: Corinna Gab, MD 12/23/2017 4:53 PM  Nursing: Hulan Amato, RN 12/23/2017 4:53 PM  RN Care Manager: 12/23/2017 4:53 PM  Social Worker: Jake Shark, LCSW 12/23/2017 4:53 PM  Recreational Therapist: Garret Reddish, LRT 12/23/2017 4:53 PM  Other:  12/23/2017 4:53 PM  Other:  12/23/2017 4:53 PM  Other: 12/23/2017 4:53 PM    Scribe for Treatment Team: Glennon Mac, LCSW 12/23/2017 4:53 PM

## 2017-12-24 LAB — GLUCOSE, CAPILLARY
GLUCOSE-CAPILLARY: 76 mg/dL (ref 70–99)
Glucose-Capillary: 255 mg/dL — ABNORMAL HIGH (ref 70–99)
Glucose-Capillary: 135 mg/dL — ABNORMAL HIGH (ref 70–99)
Glucose-Capillary: 139 mg/dL — ABNORMAL HIGH (ref 70–99)

## 2017-12-24 MED ORDER — TRAZODONE HCL 100 MG PO TABS
100.0000 mg | ORAL_TABLET | Freq: Every day | ORAL | Status: DC
  Administered 2017-12-24: 100 mg via ORAL
  Filled 2017-12-24: qty 1

## 2017-12-24 MED ORDER — PANTOPRAZOLE SODIUM 40 MG PO TBEC
40.0000 mg | DELAYED_RELEASE_TABLET | Freq: Every day | ORAL | Status: DC
  Administered 2017-12-24 – 2017-12-28 (×5): 40 mg via ORAL
  Filled 2017-12-24 (×5): qty 1

## 2017-12-24 MED ORDER — TRAZODONE HCL 50 MG PO TABS: 50.0000 mg | ORAL_TABLET | Freq: Every day | ORAL | Status: DC

## 2017-12-24 NOTE — Plan of Care (Signed)
Disheveled, poor hygiene, ambulates with a front rolling walker, denied SI/HI/AVH now; CBG=99 at bedtime; when asked for participation in his care, "As soon I have the snacks on board, I will be okay with leviemir of 16 Units as scheduled.." Pleasant, polite, peer to peer interaction appropriate.  Patient slept for Estimated Hours of 7.30; Precautionary checks every 15 minutes for safety maintained, room free of safety hazards, patient sustains no injury or falls during this shift.  Problem: Education: Goal: Emotional status will improve Outcome: Progressing Goal: Mental status will improve Outcome: Progressing Goal: Verbalization of understanding the information provided will improve Outcome: Progressing   Problem: Education: Goal: Ability to describe self-care measures that may prevent or decrease complications (Diabetes Survival Skills Education) will improve Outcome: Progressing   Problem: Coping: Goal: Ability to interact with others will improve Outcome: Progressing   Problem: Health Behavior/Discharge Planning: Goal: Compliance with therapeutic regimen will improve Outcome: Progressing

## 2017-12-24 NOTE — Plan of Care (Signed)
More visible in the milieu, compliant with treatment
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
## 2017-12-24 NOTE — Progress Notes (Signed)
Recreation Therapy Notes  Date: 12/24/2017  Time: 9:30 am   Location: Craft Room   Behavioral response: N/A   Intervention Topic: Goals  Discussion/Intervention: Patient did not attend group.   Clinical Observations/Feedback:  Patient did not attend group.   Amaris Delafuente LRT/CTRS        Arles Rumbold 12/24/2017 11:09 AM 

## 2017-12-24 NOTE — Progress Notes (Addendum)
[facility] MD Progress Note  12/24/2017 9:27 AM Jorge Olson  MRN:  [id number] Subjective:  Pt is isolative to his room. He states that he does not feel well today. He has the cold chills. Denies any other physical symptoms. He states that he did not sleep well last night and is feeling really tired. He went to one group yesterday. He did eat breakfast. He has not talked to any family members. He was encouraged to do so which he will try today. He denies SI or thoughts of self harm today but appears depressed and disheveled. He plans to take a shower today.   Principal Problem: Major depressive disorder, recurrent episode, severe (HCC) Diagnosis:   Patient Active Problem List   Diagnosis Date Noted  . Major depressive disorder, recurrent episode, severe (HCC) [F33.2] 12/21/2017    Priority: High  . Suicidal ideation [R45.851] 12/21/2017  . URI (upper respiratory infection) [J06.9] 04/30/2017  . Hypertension [I10] 09/04/2016  . Segmental and somatic dysfunction of lumbar region [M99.03] 06/11/2016  . Sciatica, left side [M54.32] 06/11/2016  . Segmental and somatic dysfunction of thoracic region [M99.02] 06/11/2016  . Muscle spasm of back [M62.830] 06/11/2016  . Spinal stenosis [M48.00] 06/05/2016  . Hyperlipidemia [E78.5] 04/19/2015  . Diabetes mellitus without complication (HCC) [E11.9] 12/16/2014   Total Time spent with patient: 20 minutes  Past Psychiatric History: See H&p  Past Medical History:  Past Medical History:  Diagnosis Date  . Diabetes mellitus without complication (HCC)   . Hyperlipidemia   . Hypertension 09/04/2016   History reviewed. No pertinent surgical history. Family History:  Family History  Problem Relation Age of Onset  . Diabetes Mellitus II Mother   . Diabetes Mellitus II Father   . Diabetes Mellitus II Sister   . Diabetes Mellitus II Sister    Family Psychiatric  History: See H&P Social History:  Social History   Substance and Sexual Activity  Alcohol  Use No     Social History   Substance and Sexual Activity  Drug Use Yes  . Types: Methamphetamines   Comment: recently relapsed    Social History   Socioeconomic History  . Marital status: Single    Spouse name: Not on file  . Number of children: Not on file  . Years of education: Not on file  . Highest education level: Not on file  Occupational History  . Not on file  Social Needs  . Financial resource strain: Not on file  . Food insecurity:    Worry: Not on file    Inability: Not on file  . Transportation needs:    Medical: Not on file    Non-medical: Not on file  Tobacco Use  . Smoking status: Current Every Day Smoker    Packs/day: 1.00    Types: Cigarettes  . Smokeless tobacco: Never Used  . Tobacco comment: Smokes less than a pack a day.  Substance and Sexual Activity  . Alcohol use: No  . 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 Sexual activity: Not on file  Lifestyle  . Physical activity:    Days per week: Not on file    Minutes per session: Not on file  . Stress: Not on file  Relationships  . Social connections:    Talks on phone: Not on file    Gets together: Not on file    Attends religious service: Not on file    Active member of  club or organization: Not on file    Attends meetings of clubs or organizations: Not on file    Relationship status: Not on file  Other Topics Concern  . Not on file  Social History Narrative  . Not on file   Additional Social History:                         Sleep: Poor  Appetite:  Fair  Current Medications: Current Facility-Administered Medications  Medication Dose Route Frequency Provider Last Rate Last Dose  . acetaminophen (TYLENOL) tablet 650 mg  650 mg Oral Q6H PRN Clapacs, Jackquline Denmark, MD   650 mg at 12/22/17 2153  . albuterol (PROVENTIL HFA;VENTOLIN HFA) 108 (90 Base) MCG/ACT inhaler 1-2 puff  1-2 puff Inhalation Q6H PRN Clapacs, John T, MD      . alum & mag  hydroxide-simeth (MAALOX/MYLANTA) 200-200-20 MG/5ML suspension 30 mL  30 mL Oral Q4H PRN Clapacs, John T, MD      . aspirin EC tablet 81 mg  81 mg Oral Daily Clapacs, Jackquline Denmark, MD   81 mg at 12/24/17 0753  . FLUoxetine (PROZAC) capsule 20 mg  20 mg Oral Daily Clapacs, Jackquline Denmark, MD   20 mg at 12/24/17 0753  . gabapentin (NEURONTIN) tablet 1,200 mg  1,200 mg Oral BID Clapacs, John T, MD   1,200 mg at 12/24/17 0752  . hydrOXYzine (ATARAX/VISTARIL) tablet 50 mg  50 mg Oral Q4H PRN Haskell Riling, MD   50 mg at 12/23/17 1636  . insulin aspart (novoLOG) injection 0-15 Units  0-15 Units Subcutaneous TID WC Clapacs, Jackquline Denmark, MD   8 Units at 12/24/17 0753  . insulin detemir (LEVEMIR) injection 16 Units  16 Units Subcutaneous QHS Clapacs, Jackquline Denmark, MD   16 Units at 12/23/17 2140  . magnesium hydroxide (MILK OF MAGNESIA) suspension 30 mL  30 mL Oral Daily PRN Clapacs, John T, MD      . metFORMIN (GLUCOPHAGE) tablet 1,000 mg  1,000 mg Oral BID WC Clapacs, Jackquline Denmark, MD   1,000 mg at 12/24/17 0753  . pantoprazole (PROTONIX) EC tablet 40 mg  40 mg Oral Daily McNew, Holly R, MD      . simvastatin (ZOCOR) tablet 10 mg  10 mg Oral Daily Clapacs, Jackquline Denmark, MD   10 mg at 12/24/17 0753  . traZODone (DESYREL) tablet 50 mg  50 mg Oral QHS McNew, Ileene Hutchinson, MD        Lab Results:  Results for orders placed or performed during the hospital encounter of 12/22/17 (from the past 48 hour(s))  Glucose, capillary     Status: Abnormal   Collection Time: 12/22/17  4:38 PM  Result Value Ref Range   Glucose-Capillary 152 (H) 70 - 99 mg/dL   Comment 1 Notify RN   Glucose, capillary     Status: Abnormal   Collection Time: 12/22/17  8:59 PM  Result Value Ref Range   Glucose-Capillary 136 (H) 70 - 99 mg/dL  Glucose, capillary     Status: Abnormal   Collection Time: 12/23/17  6:49 AM  Result Value Ref Range   Glucose-Capillary 151 (H) 70 - 99 mg/dL   Comment 1 Notify RN   Hemoglobin A1c     Status: Abnormal   Collection Time: 12/23/17   7:00 AM  Result Value Ref Range   Hgb A1c MFr Bld 6.6 (H) 4.8 - 5.6 %    Comment: (NOTE) Pre diabetes:  5.7%-6.4% Diabetes:              >6.4% Glycemic control for   <7.0% adults with diabetes    Mean Plasma Glucose 142.72 mg/dL    Comment: Performed at [facility], [address], [place], [place] [address]  Lipid panel     Status: Abnormal   Collection Time: 12/23/17  7:00 AM  Result Value Ref Range   Cholesterol 91 0 - 200 mg/dL   Triglycerides 409 <811 mg/dL   HDL 40 (L) >91 mg/dL   Total CHOL/HDL Ratio 2.3 RATIO   VLDL 21 0 - 40 mg/dL   LDL Cholesterol 30 0 - 99 mg/dL    Comment:        Total Cholesterol/HDL:CHD Risk Coronary Heart Disease Risk Table                     Men   Women  1/2 Average Risk   3.4   3.3  Average Risk       5.0   4.4  2 X Average Risk   9.6   7.1  3 X Average Risk  23.4   11.0        Use the calculated Patient Ratio above and the CHD Risk Table to determine the patient's CHD Risk.        ATP III CLASSIFICATION (LDL):  <100     mg/dL   Optimal  478-295  mg/dL   Near or Above                    Optimal  130-159  mg/dL   Borderline  621-308  mg/dL   High  >657     mg/dL   Very High Performed at [facility], [address] [place], [place] [address]   TSH     Status: None   Collection Time: 12/23/17  7:00 AM  Result Value Ref Range   TSH 0.650 0.350 - 4.500 uIU/mL    Comment: Performed by a 3rd Generation assay with a functional sensitivity of <=0.01 uIU/mL. Performed at [facility], [address] [place], [place] [address]   Glucose, capillary     Status: Abnormal   Collection Time: 12/23/17  7:56 AM  Result Value Ref Range   Glucose-Capillary 190 (H) 70 - 99 mg/dL  Urine Drug Screen, Qualitative     Status: Abnormal   Collection Time: 12/23/17 11:11 AM  Result Value Ref Range   Tricyclic, Ur Screen NONE DETECTED NONE DETECTED    Comment: CORRECTED ON 08/28 AT 1239: PREVIOUSLY REPORTED AS NOT  DETECTED   Amphetamines, Ur Screen POSITIVE (A) NONE DETECTED    Comment: CORRECTED ON 08/28 AT 1239: PREVIOUSLY REPORTED AS DETECTED   MDMA (Ecstasy)Ur Screen NONE DETECTED NONE DETECTED    Comment: CORRECTED RESULTS Jorge Olson AT 1239 12/23/17 DAS CORRECTED ON 08/28 AT 1239: PREVIOUSLY REPORTED AS NOT DETECTED    Cocaine Metabolite,Ur [place] NONE DETECTED NONE DETECTED    Comment: CORRECTED ON 08/28 AT 1239: PREVIOUSLY REPORTED AS NOT DETECTED   Opiate, Ur Screen NONE DETECTED NONE DETECTED    Comment: CORRECTED ON 08/28 AT 1239: PREVIOUSLY REPORTED AS NOT DETECTED   Phencyclidine (PCP) Ur S NONE DETECTED NONE DETECTED    Comment: CORRECTED ON 08/28 AT 1239: PREVIOUSLY REPORTED AS NOT DETECTED   Cannabinoid 50 Ng, Ur [place] NONE DETECTED NONE DETECTED    Comment: CORRECTED ON 08/28 AT 1239: PREVIOUSLY REPORTED AS  NOT DETECTED   Barbiturates, Ur Screen NONE DETECTED NONE DETECTED    Comment: CORRECTED ON 08/28 AT 1239: PREVIOUSLY REPORTED AS NOT DETECTED   Benzodiazepine, Ur Scrn TEST NOT PERFORMED, REAGENT NOT AVAILABLE (A) NONE DETECTED   Methadone Scn, Ur NONE DETECTED NONE DETECTED    Comment: (NOTE) Tricyclics + metabolites, urine    Cutoff 1000 ng/mL Amphetamines + metabolites, urine  Cutoff 1000 ng/mL MDMA (Ecstasy), urine              Cutoff 500 ng/mL Cocaine Metabolite, urine          Cutoff 300 ng/mL Opiate + metabolites, urine        Cutoff 300 ng/mL Phencyclidine (PCP), urine         Cutoff 25 ng/mL Cannabinoid, urine                 Cutoff 50 ng/mL Barbiturates + metabolites, urine  Cutoff 200 ng/mL Benzodiazepine, urine              Cutoff 200 ng/mL Methadone, urine                   Cutoff 300 ng/mL The urine drug screen provides only a preliminary, unconfirmed analytical test result and should not be used for non-medical purposes. Clinical consideration and professional judgment should be applied to any positive drug screen result due to possible interfering  substances. A more specific alternate chemical method must be used in order to obtain a confirmed analytical result. Gas chromatography / mass spectrometry (GC/MS) is the preferred confirmat ory method. Performed at [facility], [address] [place], [place] [address] CORRECTED ON 08/28 AT 1239: PREVIOUSLY REPORTED AS NOT DETECTED   Glucose, capillary     Status: Abnormal   Collection Time: 12/23/17 11:25 AM  Result Value Ref Range   Glucose-Capillary 138 (H) 70 - 99 mg/dL   Comment 1 Notify RN   Glucose, capillary     Status: Abnormal   Collection Time: 12/23/17  4:08 PM  Result Value Ref Range   Glucose-Capillary 135 (H) 70 - 99 mg/dL   Comment 1 Notify RN   Glucose, capillary     Status: None   Collection Time: 12/23/17  9:07 PM  Result Value Ref Range   Glucose-Capillary 99 70 - 99 mg/dL   Comment 1 Notify RN   Glucose, capillary     Status: Abnormal   Collection Time: 12/24/17  7:10 AM  Result Value Ref Range   Glucose-Capillary 255 (H) 70 - 99 mg/dL   Comment 1 Notify RN     Blood Alcohol level:  Lab Results  Component Value Date   ETH <10 12/20/2017    Metabolic Disorder Labs: Lab Results  Component Value Date   HGBA1C 6.6 (H) 12/23/2017   MPG 142.72 12/23/2017   No results found for: PROLACTIN Lab Results  Component Value Date   CHOL 91 12/23/2017   TRIG 107 12/23/2017   HDL 40 (L) 12/23/2017   CHOLHDL 2.3 12/23/2017   VLDL 21 12/23/2017   LDLCALC 30 12/23/2017   LDLCALC 5 08/20/2017    Physical Findings: AIMS:  , ,  ,  ,    CIWA:    COWS:     Musculoskeletal: Strength & Muscle Tone: within normal limits Gait & Station: normal Patient leans: N/A  Psychiatric Specialty Exam: Physical Exam  Nursing note and vitals reviewed.   Review of Systems  Constitutional: Positive for chills. Negative for fever.  Gastrointestinal: Positive for heartburn. Negative for diarrhea, nausea and vomiting.  Musculoskeletal: Positive for back  pain.  All other systems reviewed and are negative.   Blood pressure (!) 150/89, pulse 84, temperature 98.7 F (37.1 C), temperature source Oral, resp. rate 16, height 5\' 10"  (1.778 m), weight 81.6 kg, SpO2 99 %.Body mass index is 25.83 kg/m.  General Appearance: Disheveled  Eye Contact:  Fair  Speech:  Slow  Volume:  Normal  Mood:  Depressed  Affect:  Constricted  Thought Process:  Coherent and Goal Directed  Orientation:  Full (Time, Place, and Person)  Thought Content:  Logical  Suicidal Thoughts:  No  Homicidal Thoughts:  No  Memory:  Immediate;   Fair  Judgement:  Fair  Insight:  Fair  Psychomotor Activity:  Normal  Concentration:  Concentration: Poor  Recall:  [employer]Fair  Fund of Knowledge:  Fair  Language:  Fair  Akathisia:  No      Assets:  Resilience  ADL's:  Intact  Cognition:  WNL  Sleep:  Number of Hours: 7.3     Treatment Plan Summary: 62 yo male admitted due to worsening depression and SI. Mood is still depression but SI are resolving.   Plan:  MDD -Continue Prozac 20 mg daily -Will make trazodone scheduled instead of prn for insomnia  DM -Metformin 1000 mg BID -Levemir 16 units qhs -Sliding scale  Chronic Pain -Gabapentin 1200 mg BID for neuropathy -Prn Tylenol -Avoid NSAIDS due to kidney function  EKG reviewed, labs reviewed  Dispo -He states that he can return to live with his brother   Haskell RilingHolly R McNew, MD 12/24/2017, 9:27 AM

## 2017-12-24 NOTE — Progress Notes (Signed)
Patient was in the dayroom at the beginning of this shift. Alert and oriented. Expressing  Helplessness. Sad and depressed but denying suicidal thoughts. Denying hallucinations. Patient attended group, had a snack then presented to the medication room, sad but cooperative. Complained of stomach discomfort and received Maalox. Received bedtime medications and went to bed. No other concerns so far. Support and encouragements provided. Currently in bed and staff continue to monitor for safety.

## 2017-12-24 NOTE — [facility] Group Notes (Signed)
[facility] Group Notes:  (Nursing/MHT/Case Management/Adjunct)  Date:  12/24/2017  Time:  1:08 AM  Type of Therapy:  Group Therapy  Participation Level:  Did Not Attend   Jorge NeighborsKeith D Olson Beeck 12/24/2017, 1:08 AM

## 2017-12-24 NOTE — [facility] Group Notes (Signed)
12/24/2017 1PM  Type of Therapy/Topic:  Group Therapy:  Balance in Life  Participation Level:  Did Not Attend  Description of Group:   This group will address the concept of balance and how it feels and looks when one is unbalanced. Patients will be encouraged to process areas in their lives that are out of balance and identify reasons for remaining unbalanced. Facilitators will guide patients in utilizing problem-solving interventions to address and correct the stressor making their life unbalanced. Understanding and applying boundaries will be explored and addressed for obtaining and maintaining a balanced life. Patients will be encouraged to explore ways to assertively make their unbalanced needs known to significant others in their lives, using other group members and facilitator for support and feedback.  Therapeutic Goals: 1. Patient will identify two or more emotions or situations they have that consume much of in their lives. 2. Patient will identify signs/triggers that life has become out of balance:  3. Patient will identify two ways to set boundaries in order to achieve balance in their lives:  4. Patient will demonstrate ability to communicate their needs through discussion and/or role plays  Summary of Patient Progress: Patient was encouraged and invited to attend group. Patient did not attend group. Social worker will continue to encourage group participation in the future.        Therapeutic Modalities:   Cognitive Behavioral Therapy Solution-Focused Therapy Assertiveness Training  Deaun Rocha, LCSW  

## 2017-12-24 NOTE — Plan of Care (Signed)
Patient is alert and oriented to self, place and time. Denies having any thoughts wanting to harm himself states, but states, "I didn't sleep that well last night. I took that sleep medication but it didn't help." Md made aware of patient's concern and patient was prescribed scheduled sleep medication.  Patient present in the milieu for meals and medications. Observed in the dayroom interacting appropriately with peers. Did not attend group or go outside today. Milieu remains safe with q 15 minute safety checks. Will continue to monitor

## 2017-12-25 DIAGNOSIS — F332 Major depressive disorder, recurrent severe without psychotic features: Principal | ICD-10-CM

## 2017-12-25 LAB — GLUCOSE, CAPILLARY
GLUCOSE-CAPILLARY: 217 mg/dL — AB (ref 70–99)
GLUCOSE-CAPILLARY: 141 mg/dL — AB (ref 70–99)
Glucose-Capillary: 169 mg/dL — ABNORMAL HIGH (ref 70–99)
Glucose-Capillary: 187 mg/dL — ABNORMAL HIGH (ref 70–99)

## 2017-12-25 MED ORDER — TRAZODONE HCL 50 MG PO TABS
150.0000 mg | ORAL_TABLET | Freq: Every day | ORAL | Status: DC
  Administered 2017-12-25 – 2017-12-27 (×3): 150 mg via ORAL
  Filled 2017-12-25 (×3): qty 1

## 2017-12-25 MED ORDER — GABAPENTIN 400 MG PO CAPS: 1200.0000 mg | ORAL_CAPSULE | Freq: Two times a day (BID) | ORAL | Status: DC

## 2017-12-25 MED ORDER — TRAZODONE HCL 150 MG PO TABS: 150.0000 mg | ORAL_TABLET | Freq: Every day | ORAL | 0 refills | Status: DC

## 2017-12-25 MED ORDER — FLUOXETINE HCL 20 MG PO CAPS: 20.0000 mg | ORAL_CAPSULE | Freq: Every day | ORAL | 0 refills | Status: DC

## 2017-12-25 NOTE — Progress Notes (Signed)
Received Jorge Olson this AM after breakfast, he was compliant with his insulin and medications after breakfast. He denied all of the psychiatric symptoms. He continues to walk with his walker and was medicated with tylenol for a pain score of 7/10, lower back pain. He returned to his room to rest. No change in his status this PM.

## 2017-12-25 NOTE — [facility] Group Notes (Signed)
[facility] Group Notes:  (Nursing/MHT/Case Management/Adjunct)  Date:  12/25/2017  Time:  2:07 AM  Type of Therapy:  Group Therapy  Participation Level:  Active  Participation Quality:  Appropriate  Affect:  Flat  Cognitive:  Alert  Insight:  Good  Engagement in Group:  Engaged  Modes of Intervention:  Support  Summary of Progress/Problems:  Caryn BeeJessica  Marvelous Bouwens 12/25/2017, 2:07 AM

## 2017-12-25 NOTE — [facility] Group Notes (Signed)
LCSW Group Therapy Note  12/25/2017 1:00 pm  Type of Therapy and Topic:  Group Therapy:  Feelings around Relapse and Recovery  Participation Level:  Did Not Attend   Description of Group:    Patients in this group will discuss emotions they experience before and after a relapse. They will process how experiencing these feelings, or avoidance of experiencing them, relates to having a relapse. Facilitator will guide patients to explore emotions they have related to recovery. Patients will be encouraged to process which emotions are more powerful. They will be guided to discuss the emotional reaction significant others in their lives may have to their relapse or recovery. Patients will be assisted in exploring ways to respond to the emotions of others without this contributing to a relapse.  Therapeutic Goals: 1. Patient will identify two or more emotions that lead to a relapse for them 2. Patient will identify two emotions that result when they relapse 3. Patient will identify two emotions related to recovery 4. Patient will demonstrate ability to communicate their needs through discussion and/or role plays   Summary of Patient Progress:  Jorge Olson was invited to today's group, but chose not to attend.   Therapeutic Modalities:   Cognitive Behavioral Therapy Solution-Focused Therapy Assertiveness Training Relapse Prevention Therapy   Alease FrameSonya S Kalvin Buss, LCSW 12/25/2017 1:58 PM

## 2017-12-25 NOTE — Progress Notes (Signed)
Recreation Therapy Notes  Date: 12/25/2017  Time: 9:30 am   Location: Outside   Behavioral response: N/A   Intervention Topic: Leisure  Discussion/Intervention: Patient did not attend group.   Clinical Observations/Feedback:  Patient did not attend group.   Rosette Bellavance LRT/CTRS        Sima Lindenberger 12/25/2017 12:48 PM

## 2017-12-25 NOTE — Progress Notes (Signed)
[facility]  12/25/2017 1:04 PM Jorge Olson  MRN:  [id number] Subjective:  Pt states that he is starting to feel better. He is out of his room more and interacting with other peers. HE has brighter affect. When asked what is better, he states "Everything." He reports improvement in mood and feels much less depressed. He denies SI or any thoughts of self harm. He states that he is much less hopeless. He did not sleep well again last night and wants something stronger for sleep. He plans to call his brother today to touch base with him. He states that he plans to follow up with [facility] and has all the information taht Lorella NimrodHarvey provided for him. He is much brighter today.   Principal Problem: Major depressive disorder, recurrent episode, severe (HCC) Diagnosis:   Patient Active Problem List   Diagnosis Date Noted  . Major depressive disorder, recurrent episode, severe (HCC) [F33.2] 12/21/2017    Priority: High  . Suicidal ideation [R45.851] 12/21/2017  . URI (upper respiratory infection) [J06.9] 04/30/2017  . Hypertension [I10] 09/04/2016  . Segmental and somatic dysfunction of lumbar region [M99.03] 06/11/2016  . Sciatica, left side [M54.32] 06/11/2016  . Segmental and somatic dysfunction of thoracic region [M99.02] 06/11/2016  . Muscle spasm of back [M62.830] 06/11/2016  . Spinal stenosis [M48.00] 06/05/2016  . Hyperlipidemia [E78.5] 04/19/2015  . Diabetes mellitus without complication (HCC) [E11.9] 12/16/2014   Total Time spent with patient: 20 minutes  Past Psychiatric History: See H&p  Past Medical History:  Past Medical History:  Diagnosis Date  . Diabetes mellitus without complication (HCC)   . Hyperlipidemia   . Hypertension 09/04/2016   History reviewed. No pertinent surgical history. Family History:  Family History  Problem Relation Age of Onset  . Diabetes Mellitus II Mother   . Diabetes Mellitus II Father   . Diabetes Mellitus II Sister   . Diabetes Mellitus II  Sister    Family Psychiatric  History: See H&P Social History:  Social History   Substance and Sexual Activity  Alcohol Use No     Social History   Substance and Sexual Activity  Drug Use Yes  . Types: Methamphetamines   Comment: recently relapsed    Social History   Socioeconomic History  . Marital status: Single    Spouse name: Not on file  . Number of children: Not on file  . Years of education: Not on file  . Highest education level: Not on file  Occupational History  . Not on file  Social Needs  . Financial resource strain: Not on file  . Food insecurity:    Worry: Not on file    Inability: Not on file  . Transportation needs:    Medical: Not on file    Non-medical: Not on file  Tobacco Use  . Smoking status: Current Every Day Smoker    Packs/day: 1.00    Types: Cigarettes  . Smokeless tobacco: Never Used  . Tobacco comment: Smokes less than a pack a day.  Substance and Sexual Activity  . Alcohol use: No  . 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 Sexual activity: Not on file  Lifestyle  . Physical activity:    Days per week: Not on file    Minutes per session: Not on file  . Stress: Not on file  Relationships  . Social connections:    Talks on phone: Not on file  Gets together: Not on file    Attends religious service: Not on file    Active member of club or organization: Not on file    Attends meetings of clubs or organizations: Not on file    Relationship status: Not on file  Other Topics Concern  . Not on file  Social History Narrative  . Not on file   Additional Social History:                         Sleep: Poor  Appetite:  Good  Current Medications: Current Facility-Administered Medications  Medication Dose Route Frequency Provider Last Rate Last Dose  . acetaminophen (TYLENOL) tablet 650 mg  650 mg Oral Q6H PRN Clapacs, John T, MD   650 mg at 12/25/17 0811  . albuterol (PROVENTIL  HFA;VENTOLIN HFA) 108 (90 Base) MCG/ACT inhaler 1-2 puff  1-2 puff Inhalation Q6H PRN Clapacs, John T, MD      . alum & mag hydroxide-simeth (MAALOX/MYLANTA) 200-200-20 MG/5ML suspension 30 mL  30 mL Oral Q4H PRN Clapacs, Jackquline Denmark, MD   30 mL at 12/24/17 2124  . aspirin EC tablet 81 mg  81 mg Oral Daily Clapacs, Jackquline Denmark, MD   81 mg at 12/25/17 0807  . FLUoxetine (PROZAC) capsule 20 mg  20 mg Oral Daily Clapacs, Jackquline Denmark, MD   20 mg at 12/25/17 0807  . gabapentin (NEURONTIN) tablet 1,200 mg  1,200 mg Oral BID Clapacs, John T, MD   1,200 mg at 12/25/17 0807  . hydrOXYzine (ATARAX/VISTARIL) tablet 50 mg  50 mg Oral Q4H PRN Haskell Riling, MD   50 mg at 12/23/17 1636  . insulin aspart (novoLOG) injection 0-15 Units  0-15 Units Subcutaneous TID WC Clapacs, Jackquline Denmark, MD   3 Units at 12/25/17 1126  . insulin detemir (LEVEMIR) injection 16 Units  16 Units Subcutaneous QHS Clapacs, Jackquline Denmark, MD   16 Units at 12/24/17 2123  . magnesium hydroxide (MILK OF MAGNESIA) suspension 30 mL  30 mL Oral Daily PRN Clapacs, John T, MD      . metFORMIN (GLUCOPHAGE) tablet 1,000 mg  1,000 mg Oral BID WC Clapacs, Jackquline Denmark, MD   1,000 mg at 12/25/17 0807  . pantoprazole (PROTONIX) EC tablet 40 mg  40 mg Oral Daily Lillion Elbert, Ileene Hutchinson, MD   40 mg at 12/25/17 0807  . simvastatin (ZOCOR) tablet 10 mg  10 mg Oral Daily Clapacs, Jackquline Denmark, MD   10 mg at 12/25/17 0809  . traZODone (DESYREL) tablet 100 mg  100 mg Oral QHS Tanda Morrissey, Ileene Hutchinson, MD   100 mg at 12/24/17 2124    Lab Results:  Results for orders placed or performed during the hospital encounter of 12/22/17 (from the past 48 hour(s))  Glucose, capillary     Status: Abnormal   Collection Time: 12/23/17  4:08 PM  Result Value Ref Range   Glucose-Capillary 135 (H) 70 - 99 mg/dL   Comment 1 Notify RN   Glucose, capillary     Status: None   Collection Time: 12/23/17  9:07 PM  Result Value Ref Range   Glucose-Capillary 99 70 - 99 mg/dL   Comment 1 Notify RN   Glucose, capillary     Status:  Abnormal   Collection Time: 12/24/17  7:10 AM  Result Value Ref Range   Glucose-Capillary 255 (H) 70 - 99 mg/dL   Comment 1 Notify RN   Glucose, capillary  Status: None   Collection Time: 12/24/17 11:04 AM  Result Value Ref Range   Glucose-Capillary 76 70 - 99 mg/dL   Comment 1 Notify RN   Glucose, capillary     Status: Abnormal   Collection Time: 12/24/17  4:28 PM  Result Value Ref Range   Glucose-Capillary 135 (H) 70 - 99 mg/dL   Comment 1 Notify RN   Glucose, capillary     Status: Abnormal   Collection Time: 12/24/17  9:01 PM  Result Value Ref Range   Glucose-Capillary 139 (H) 70 - 99 mg/dL   Comment 1 Notify RN   Glucose, capillary     Status: Abnormal   Collection Time: 12/25/17  6:57 AM  Result Value Ref Range   Glucose-Capillary 217 (H) 70 - 99 mg/dL   Comment 1 Notify RN   Glucose, capillary     Status: Abnormal   Collection Time: 12/25/17 11:23 AM  Result Value Ref Range   Glucose-Capillary 169 (H) 70 - 99 mg/dL    Blood Alcohol level:  Lab Results  Component Value Date   ETH <10 12/20/2017    Metabolic Disorder Labs: Lab Results  Component Value Date   HGBA1C 6.6 (H) 12/23/2017   MPG 142.72 12/23/2017   No results found for: PROLACTIN Lab Results  Component Value Date   CHOL 91 12/23/2017   TRIG 107 12/23/2017   HDL 40 (L) 12/23/2017   CHOLHDL 2.3 12/23/2017   VLDL 21 12/23/2017   LDLCALC 30 12/23/2017   LDLCALC 5 08/20/2017    Physical Findings: AIMS:  , ,  ,  ,    CIWA:    COWS:     Musculoskeletal: Strength & Muscle Tone: within normal limits Gait & Station: Unsteady, uses walker Patient leans: N/A  Psychiatric Specialty Exam: Physical Exam  Nursing note and vitals reviewed.   Review of Systems  All other systems reviewed and are negative.   Blood pressure 133/80, pulse 90, temperature 98 F (36.7 C), temperature source Oral, resp. rate 18, height 5\' 10"  (1.778 m), weight 81.6 kg, SpO2 98 %.Body mass index is 25.83 kg/m.   General Appearance: Casual  Eye Contact:  Good  Speech:  Clear and Coherent  Volume:  Normal  Mood:  Euthymic  Affect:  Brighter today  Thought Process:  Coherent and Goal Directed  Orientation:  Full (Time, Place, and Person)  Thought Content:  Logical  Suicidal Thoughts:  No  Homicidal Thoughts:  No  Memory:  Recent;   Fair  Judgement:  Fair  Insight:  Fair  Psychomotor Activity:  Normal  Concentration:  Concentration: Fair  Recall:  [employer] of Knowledge:  Fair  Language:  Fair  Akathisia:  No      Assets:  Resilience  ADL's:  Intact  Cognition:  WNL  Sleep:  Number of Hours: 8     Treatment Plan Summary: 62 yo male admitted due to worsening depression and SI. He reports improvement in mood and SI are resolving. He does have brighter affect today and is out of his room more.   Plan:  MDD -Continue Prozac 20 mg daily  Insomnia -Continue trazodone to 150 mg qhs  DM -Metformin 1000 mg BID -Levemir 16 units qhs -Sliding scale  Chronic pain -Gabapentin 1200 mg BID -prn Tylenol -Avoiding NSAIDS due to kidney function  EKG reviewed, labs reviewed  Dispo -He states that he can return to live with his brother. Likely discharge Monday    [facility]  Shahad Mazurek, MD 12/25/2017, 1:04 PM

## 2017-12-26 LAB — GLUCOSE, CAPILLARY
GLUCOSE-CAPILLARY: 164 mg/dL — AB (ref 70–99)
Glucose-Capillary: 246 mg/dL — ABNORMAL HIGH (ref 70–99)
Glucose-Capillary: 159 mg/dL — ABNORMAL HIGH (ref 70–99)
Glucose-Capillary: 145 mg/dL — ABNORMAL HIGH (ref 70–99)

## 2017-12-26 NOTE — [facility] Group Notes (Signed)
LCSW Group Therapy Note  12/26/2017 1:15pm  Type of Therapy and Topic:  Group Therapy:  Cognitive Distortions  Participation Level:  Did Not Attend   Description of Group:    Patients in this group will be introduced to the topic of cognitive distortions.  Patients will identify and describe cognitive distortions, describe the feelings these distortions create for them.  Patients will identify one or more situations in their personal life where they have cognitively distorted thinking and will verbalize challenging this cognitive distortion through positive thinking skills.  Patients will practice the skill of using positive affirmations to challenge cognitive distortions using affirmation cards.    Therapeutic Goals:  1. Patient will identify two or more cognitive distortions they have used 2. Patient will identify one or more emotions that stem from use of a cognitive distortion 3. Patient will demonstrate use of a positive affirmation to counter a cognitive distortion through discussion and/or role play. 4. Patient will describe one way cognitive distortions can be detrimental to wellness   Summary of Patient Progress: Pt was invited to attend group but chose not to attend. CSW will continue to encourage pt to attend group throughout their admission.      Therapeutic Modalities:   Cognitive Behavioral Therapy Motivational Interviewing   Janesia Joswick  CUEBAS-COLON, LCSW 12/26/2017 12:47 PM   

## 2017-12-26 NOTE — Plan of Care (Signed)
Patient is coping nicely and redirectable , attends groups with participation , socialize with peers with out any issues, compliant with his medicines , no noticeable side effects, sleep long hours with out any interruptions, SIWA score is 2 from anxiety, voice no complains, patient is adjusting to routine  unit safety , denies any SI/HI /AVH no distress noted support and encouragement is provided . Problem: Education: Goal: Knowledge of [facility] Education information/materials will improve Outcome: Progressing Goal: Emotional status will improve Outcome: Progressing Goal: Mental status will improve Outcome: Progressing Goal: Verbalization of understanding the information provided will improve Outcome: Progressing   Problem: Education: Goal: Ability to describe self-care measures that may prevent or decrease complications (Diabetes Survival Skills Education) will improve Outcome: Progressing   Problem: Coping: Goal: Ability to identify and develop effective coping behavior will improve Outcome: Progressing Goal: Ability to interact with others will improve Outcome: Progressing Goal: Demonstration of participation in decision-making regarding own care will improve Outcome: Progressing Goal: Ability to use eye contact when communicating with others will improve Outcome: Progressing   Problem: Health Behavior/Discharge Planning: Goal: Ability to make decisions will improve Outcome: Progressing Goal: Compliance with therapeutic regimen will improve Outcome: Progressing   Problem: Education: Goal: Knowledge of General Education information will improve Description Including pain rating scale, medication(s)/side effects and non-pharmacologic comfort measures Outcome: Progressing   Problem: Health Behavior/Discharge Planning: Goal: Ability to manage health-related needs will improve Outcome: Progressing   Problem: Clinical Measurements: Goal: Ability to maintain clinical  measurements within normal limits will improve Outcome: Progressing Goal: Will remain free from infection Outcome: Progressing Goal: Diagnostic test results will improve Outcome: Progressing Goal: Respiratory complications will improve Outcome: Progressing Goal: Cardiovascular complication will be avoided Outcome: Progressing   Problem: Activity: Goal: Risk for activity intolerance will decrease Outcome: Progressing   Problem: Nutrition: Goal: Adequate nutrition will be maintained Outcome: Progressing   Problem: Coping: Goal: Level of anxiety will decrease Outcome: Progressing   Problem: Elimination: Goal: Will not experience complications related to bowel motility Outcome: Progressing Goal: Will not experience complications related to urinary retention Outcome: Progressing   Problem: Pain Managment: Goal: General experience of comfort will improve Outcome: Progressing   Problem: Safety: Goal: Ability to remain free from injury will improve Outcome: Progressing   Problem: Skin Integrity: Goal: Risk for impaired skin integrity will decrease Outcome: Progressing

## 2017-12-26 NOTE — Progress Notes (Signed)
Received Jorge Olson this AM, he was compliant with his medications. He denied all of the psychiatric symptoms and verbalized those symptoms are resolved. He continues to walk with his walker without C/O pain.

## 2017-12-26 NOTE — Progress Notes (Signed)
[facility]  12/26/2017 3:26 PM Jorge Olson  MRN:  [id number]   Subjective:  Pt reports feeling better and denied SI.  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 Jorge Olson said that his application was denied 5x, and now Jorge Olson is working with a Clinical research associatelawyer on that.   Jorge Olson tolerates meds without complaints.   Principal Problem: Major depressive disorder, recurrent episode, severe (HCC) Diagnosis:   Patient Active Problem List   Diagnosis Date Noted  . Major depressive disorder, recurrent episode, severe (HCC) [F33.2] 12/21/2017  . Suicidal ideation [R45.851] 12/21/2017  . URI (upper respiratory infection) [J06.9] 04/30/2017  . Hypertension [I10] 09/04/2016  . Segmental and somatic dysfunction of lumbar region [M99.03] 06/11/2016  . Sciatica, left side [M54.32] 06/11/2016  . Segmental and somatic dysfunction of thoracic region [M99.02] 06/11/2016  . Muscle spasm of back [M62.830] 06/11/2016  . Spinal stenosis [M48.00] 06/05/2016  . Hyperlipidemia [E78.5] 04/19/2015  . Diabetes mellitus without complication (HCC) [E11.9] 12/16/2014   Total Time spent with patient: 20 minutes  Past Psychiatric History: See H&p  Past Medical History:  Past Medical History:  Diagnosis Date  . Diabetes mellitus without complication (HCC)   . Hyperlipidemia   . Hypertension 09/04/2016   History reviewed. No pertinent surgical history. Family History:  Family History  Problem Relation Age of Onset  . Diabetes Mellitus II Mother   . Diabetes Mellitus II Father   . Diabetes Mellitus II Sister   . Diabetes Mellitus II Sister    Family Psychiatric  History: See H&P Social History:  Social History   Substance and Sexual Activity  Alcohol Use No     Social History   Substance and Sexual Activity  Drug Use Yes  . Types: Methamphetamines   Comment: recently relapsed    Social History   Socioeconomic History  . Marital status:  Single    Spouse name: Not on file  . Number of children: Not on file  . Years of education: Not on file  . Highest education level: Not on file  Occupational History  . Not on file  Social Needs  . Financial resource strain: Not on file  . Food insecurity:    Worry: Not on file    Inability: Not on file  . Transportation needs:    Medical: Not on file    Non-medical: Not on file  Tobacco Use  . Smoking status: Current Every Day Smoker    Packs/day: 1.00    Types: Cigarettes  . Smokeless tobacco: Never Used  . Tobacco comment: Smokes less than a pack a day.  Substance and Sexual Activity  . Alcohol use: No  . 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 Sexual activity: Not on file  Lifestyle  . Physical activity:    Days per week: Not on file    Minutes per session: Not on file  . Stress: Not on file  Relationships  . Social connections:    Talks on phone: Not on file    Gets together: Not on file    Attends religious service: Not on file    Active member of club or organization: Not on file    Attends meetings of clubs or organizations: Not on file    Relationship status: Not on file  Other Topics Concern  . Not on file  Social  History Narrative  . Not on file   Additional Social History:   Sleep: Poor  Appetite:  Good  Current Medications: Current Facility-Administered Medications  Medication Dose Route Frequency Provider Last Rate Last Dose  . acetaminophen (TYLENOL) tablet 650 mg  650 mg Oral Q6H PRN Clapacs, John T, MD   650 mg at 12/25/17 0811  . albuterol (PROVENTIL HFA;VENTOLIN HFA) 108 (90 Base) MCG/ACT inhaler 1-2 puff  1-2 puff Inhalation Q6H PRN Clapacs, John T, MD      . alum & mag hydroxide-simeth (MAALOX/MYLANTA) 200-200-20 MG/5ML suspension 30 mL  30 mL Oral Q4H PRN Clapacs, Jackquline Denmark, MD   30 mL at 12/24/17 2124  . aspirin EC tablet 81 mg  81 mg Oral Daily Clapacs, Jackquline Denmark, MD   81 mg at 12/26/17 1610  . FLUoxetine  (PROZAC) capsule 20 mg  20 mg Oral Daily Clapacs, Jackquline Denmark, MD   20 mg at 12/26/17 0815  . gabapentin (NEURONTIN) tablet 1,200 mg  1,200 mg Oral BID Clapacs, John T, MD   1,200 mg at 12/26/17 0814  . hydrOXYzine (ATARAX/VISTARIL) tablet 50 mg  50 mg Oral Q4H PRN Haskell Riling, MD   50 mg at 12/23/17 1636  . insulin aspart (novoLOG) injection 0-15 Units  0-15 Units Subcutaneous TID WC Clapacs, Jackquline Denmark, MD   2 Units at 12/26/17 [phone]  . insulin detemir (LEVEMIR) injection 16 Units  16 Units Subcutaneous QHS Clapacs, Jackquline Denmark, MD   16 Units at 12/25/17 2138  . magnesium hydroxide (MILK OF MAGNESIA) suspension 30 mL  30 mL Oral Daily PRN Clapacs, John T, MD      . metFORMIN (GLUCOPHAGE) tablet 1,000 mg  1,000 mg Oral BID WC Clapacs, Jackquline Denmark, MD   1,000 mg at 12/26/17 0814  . pantoprazole (PROTONIX) EC tablet 40 mg  40 mg Oral Daily McNew, Ileene Hutchinson, MD   40 mg at 12/26/17 0814  . simvastatin (ZOCOR) tablet 10 mg  10 mg Oral Daily Clapacs, Jackquline Denmark, MD   10 mg at 12/26/17 0815  . traZODone (DESYREL) tablet 150 mg  150 mg Oral QHS McNew, Ileene Hutchinson, MD   150 mg at 12/25/17 2137    Jorge Olson Results:  Results for orders placed or performed during the hospital encounter of 12/22/17 (from the past 48 hour(s))  Glucose, capillary     Status: Abnormal   Collection Time: 12/24/17  4:28 PM  Result Value Ref Range   Glucose-Capillary 135 (H) 70 - 99 mg/dL   Comment 1 Notify RN   Glucose, capillary     Status: Abnormal   Collection Time: 12/24/17  9:01 PM  Result Value Ref Range   Glucose-Capillary 139 (H) 70 - 99 mg/dL   Comment 1 Notify RN   Glucose, capillary     Status: Abnormal   Collection Time: 12/25/17  6:57 AM  Result Value Ref Range   Glucose-Capillary 217 (H) 70 - 99 mg/dL   Comment 1 Notify RN   Glucose, capillary     Status: Abnormal   Collection Time: 12/25/17 11:23 AM  Result Value Ref Range   Glucose-Capillary 169 (H) 70 - 99 mg/dL  Glucose, capillary     Status: Abnormal   Collection Time: 12/25/17   4:31 PM  Result Value Ref Range   Glucose-Capillary 141 (H) 70 - 99 mg/dL   Comment 1 Notify RN   Glucose, capillary     Status: Abnormal   Collection Time: 12/25/17  9:35 PM  Result Value Ref Range   Glucose-Capillary 187 (H) 70 - 99 mg/dL  Glucose, capillary     Status: Abnormal   Collection Time: 12/26/17  7:00 AM  Result Value Ref Range   Glucose-Capillary 246 (H) 70 - 99 mg/dL  Glucose, capillary     Status: Abnormal   Collection Time: 12/26/17 11:31 AM  Result Value Ref Range   Glucose-Capillary 159 (H) 70 - 99 mg/dL    Blood Alcohol level:  Jorge Olson Results  Component Value Date   ETH <10 12/20/2017    Metabolic Disorder Labs: Jorge Olson Results  Component Value Date   HGBA1C 6.6 (H) 12/23/2017   MPG 142.72 12/23/2017   No results found for: PROLACTIN Jorge Olson Results  Component Value Date   CHOL 91 12/23/2017   TRIG 107 12/23/2017   HDL 40 (L) 12/23/2017   CHOLHDL 2.3 12/23/2017   VLDL 21 12/23/2017   LDLCALC 30 12/23/2017   LDLCALC 5 08/20/2017    Physical Findings: AIMS:  , ,  ,  ,    CIWA:    COWS:     Musculoskeletal: Strength & Muscle Tone: within normal limits Gait & Station: Unsteady, uses walker Patient leans: N/A  Psychiatric Specialty Exam: Physical Exam  Nursing note and vitals reviewed.   Review of Systems  All other systems reviewed and are negative.   Blood pressure 132/72, pulse 84, temperature 97.8 F (36.6 C), temperature source Oral, resp. rate 16, height 5\' 10"  (1.778 m), weight 81.6 kg, SpO2 98 %.Body mass index is 25.83 kg/m.  General Appearance: Casual  Eye Contact:  Good  Speech:  Clear and Coherent and Normal Rate  Volume:  Normal  Mood:  Depressed  Affect:  Congruent, Constricted and Depressed  Thought Process:  Coherent, Goal Directed and Linear  Orientation:  Full (Time, Place, and Person)  Thought Content:  Logical  Suicidal Thoughts:  No  Homicidal Thoughts:  No  Memory:  Recent;   Fair  Judgement:  Fair  Insight:  Fair   Psychomotor Activity:  Normal  Concentration:  Concentration: Fair  Recall:  [employer] of Knowledge:  Fair  Language:  Fair  Akathisia:  No      Assets:  Communication Skills Resilience  ADL's:  Intact  Cognition:  WNL  Sleep:  Number of Hours: 7.3     Treatment Plan Summary: 62 yo male admitted due to worsening depression and SI. Jorge Olson reports improvement in mood and SI are resolving. Jorge Olson does have brighter affect today and is out of his room more.   Plan:  MDD -Continue Prozac 20 mg daily  Insomnia -Continue trazodone to 150 mg qhs  DM -Metformin 1000 mg BID -Levemir 16 units qhs -Sliding scale  Chronic pain -Gabapentin 1200 mg BID -prn Tylenol -Avoiding NSAIDS due to kidney function  EKG reviewed, labs reviewed  Dispo -Jorge Olson states that Labrea Eccleston can return to live with his brother. Likely discharge Monday    Fransico Sciandra, MD 12/26/2017, 3:26 PM

## 2017-12-27 LAB — GLUCOSE, CAPILLARY
GLUCOSE-CAPILLARY: 159 mg/dL — AB (ref 70–99)
Glucose-Capillary: 205 mg/dL — ABNORMAL HIGH (ref 70–99)
Glucose-Capillary: 135 mg/dL — ABNORMAL HIGH (ref 70–99)
Glucose-Capillary: 132 mg/dL — ABNORMAL HIGH (ref 70–99)

## 2017-12-27 NOTE — Plan of Care (Signed)
Patient is responding well to treatment and feeling better about himself and coping nicely voice no complain and denies SI/HI/AVH safety is maintained and 15 minute rounding is in progress, sleep long hours and compliant with his medicines no distress.   Problem: Education: Goal: Knowledge of [place] [facility] Education information/materials will improve Outcome: Progressing Goal: Emotional status will improve Outcome: Progressing Goal: Mental status will improve Outcome: Progressing Goal: Verbalization of understanding the information provided will improve Outcome: Progressing   Problem: Education: Goal: Ability to describe self-care measures that may prevent or decrease complications (Diabetes Survival Skills Education) will improve Outcome: Progressing   Problem: Coping: Goal: Ability to identify and develop effective coping behavior will improve Outcome: Progressing Goal: Ability to interact with others will improve Outcome: Progressing Goal: Demonstration of participation in decision-making regarding own care will improve Outcome: Progressing Goal: Ability to use eye contact when communicating with others will improve Outcome: Progressing   Problem: Health Behavior/Discharge Planning: Goal: Ability to make decisions will improve Outcome: Progressing Goal: Compliance with therapeutic regimen will improve Outcome: Progressing   Problem: Education: Goal: Knowledge of General Education information will improve Description Including pain rating scale, medication(s)/side effects and non-pharmacologic comfort measures Outcome: Progressing   Problem: Health Behavior/Discharge Planning: Goal: Ability to manage health-related needs will improve Outcome: Progressing   Problem: Clinical Measurements: Goal: Ability to maintain clinical measurements within normal limits will improve Outcome: Progressing Goal: Will remain free from infection Outcome: Progressing Goal:  Diagnostic test results will improve Outcome: Progressing Goal: Respiratory complications will improve Outcome: Progressing Goal: Cardiovascular complication will be avoided Outcome: Progressing   Problem: Activity: Goal: Risk for activity intolerance will decrease Outcome: Progressing   Problem: Nutrition: Goal: Adequate nutrition will be maintained Outcome: Progressing   Problem: Coping: Goal: Level of anxiety will decrease Outcome: Progressing   Problem: Elimination: Goal: Will not experience complications related to bowel motility Outcome: Progressing Goal: Will not experience complications related to urinary retention Outcome: Progressing   Problem: Pain Managment: Goal: General experience of comfort will improve Outcome: Progressing   Problem: Safety: Goal: Ability to remain free from injury will improve Outcome: Progressing   Problem: Skin Integrity: Goal: Risk for impaired skin integrity will decrease Outcome: Progressing

## 2017-12-27 NOTE — [facility] Group Notes (Signed)
LCSW Group Therapy Note 12/27/2017 1:15pm  Type of Therapy and Topic: Group Therapy: Feelings Around Returning Home & Establishing a Supportive Framework and Supporting Oneself When Supports Not Available  Participation Level: Did Not Attend  Description of Group:  Patients first processed thoughts and feelings about upcoming discharge. These included fears of upcoming changes, lack of change, new living environments, judgements and expectations from others and overall stigma of mental health issues. The group then discussed the definition of a supportive framework, what that looks and feels like, and how do to discern it from an unhealthy non-supportive network. The group identified different types of supports as well as what to do when your family/friends are less than helpful or unavailable  Therapeutic Goals  1. Patient will identify one healthy supportive network that they can use at discharge. 2. Patient will identify one factor of a supportive framework and how to tell it from an unhealthy network. 3. Patient able to identify one coping skill to use when they do not have positive supports from others. 4. Patient will demonstrate ability to communicate their needs through discussion and/or role plays.  Summary of Patient Progress:  Pt was invited to attend group but chose not to attend. CSW will continue to encourage pt to attend group throughout their admission.    Therapeutic Modalities Cognitive Behavioral Therapy Motivational Interviewing   Leandria Thier  CUEBAS-COLON, LCSW 12/27/2017 2:35 PM 

## 2017-12-27 NOTE — Progress Notes (Signed)
Received Kron this AM after breakfast, he was compliant with his medications and insulin. He continues to deny all of the psychiatric symptoms and looking forward to discharge this week to his brothers home. No change in his status this PM.

## 2017-12-27 NOTE — Progress Notes (Signed)
[facility] MD Progress Note  12/27/2017 2:25 PM Jorge Olson  MRN:  [id number]   Subjective:  Pt continues to report doing better, and denied SI.  However, Jorge Olson spends most of his day lying bed. Jorge Olson said that his back pain is worse, and we are not giving him enough Tylenol.  Jorge Olson said that Jorge Olson takes 3 tabs of 650mg  at one time at home. Jorge Olson is instructed not to do that given significant liver toxicity.   Principal Problem: Major depressive disorder, recurrent episode, severe (HCC) Diagnosis:   Patient Active Problem List   Diagnosis Date Noted  . Major depressive disorder, recurrent episode, severe (HCC) [F33.2] 12/21/2017  . Suicidal ideation [R45.851] 12/21/2017  . URI (upper respiratory infection) [J06.9] 04/30/2017  . Hypertension [I10] 09/04/2016  . Segmental and somatic dysfunction of lumbar region [M99.03] 06/11/2016  . Sciatica, left side [M54.32] 06/11/2016  . Segmental and somatic dysfunction of thoracic region [M99.02] 06/11/2016  . Muscle spasm of back [M62.830] 06/11/2016  . Spinal stenosis [M48.00] 06/05/2016  . Hyperlipidemia [E78.5] 04/19/2015  . Diabetes mellitus without complication (HCC) [E11.9] 12/16/2014   Total Time spent with patient: 15 minutes  Past Psychiatric History: See H&p  Past Medical History:  Past Medical History:  Diagnosis Date  . Diabetes mellitus without complication (HCC)   . Hyperlipidemia   . Hypertension 09/04/2016   History reviewed. No pertinent surgical history. Family History:  Family History  Problem Relation Age of Onset  . Diabetes Mellitus II Mother   . Diabetes Mellitus II Father   . Diabetes Mellitus II Sister   . 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 Marital status: Single    Spouse name: Not on file  . Number of children: Not on file  . Years of education: Not on file  . Highest education level: Not on file  Occupational History  . Not on file  Social Needs  . Financial resource strain: Not on file  . Food insecurity:    Worry: Not on file    Inability: Not on file  . Transportation needs:    Medical: Not on file    Non-medical: Not on file  Tobacco Use  . Smoking status: Current Every Day Smoker    Packs/day: 1.00    Types: Cigarettes  . Smokeless tobacco: Never Used  . Tobacco comment: Smokes less than a pack a day.  Substance and Sexual Activity  . Alcohol use: No  . 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 Sexual activity: Not on file  Lifestyle  . Physical activity:    Days per week: Not on file    Minutes per session: Not on file  . Stress: Not on file  Relationships  . Social connections:    Talks on phone: Not on file    Gets together: Not on file    Attends religious service: Not on file    Active member of club or organization: Not on file    Attends meetings of clubs or organizations: Not on file    Relationship status: Not on file  Other Topics Concern  .  Not on file  Social History Narrative  . Not on file   Additional Social History:   Sleep: Poor  Appetite:  Good  Current Medications: Current Facility-Administered Medications  Medication Dose Route Frequency Provider Last Rate Last Dose  . acetaminophen (TYLENOL) tablet 650 mg  650 mg Oral Q6H PRN Clapacs, John T, MD   650 mg at 12/25/17 0811  . albuterol (PROVENTIL HFA;VENTOLIN HFA) 108 (90 Base) MCG/ACT inhaler 1-2 puff  1-2 puff Inhalation Q6H PRN Clapacs, John T, MD      . alum & mag hydroxide-simeth (MAALOX/MYLANTA) 200-200-20 MG/5ML suspension 30 mL  30 mL Oral Q4H PRN Clapacs, Jackquline Denmark, MD   30 mL at 12/24/17 2124  . aspirin EC tablet 81 mg  81 mg Oral Daily Clapacs, Jackquline Denmark, MD   81 mg at 12/27/17  0815  . FLUoxetine (PROZAC) capsule 20 mg  20 mg Oral Daily Clapacs, Jackquline Denmark, MD   20 mg at 12/27/17 0815  . gabapentin (NEURONTIN) tablet 1,200 mg  1,200 mg Oral BID Clapacs, John T, MD   1,200 mg at 12/27/17 0815  . hydrOXYzine (ATARAX/VISTARIL) tablet 50 mg  50 mg Oral Q4H PRN Haskell Riling, MD   50 mg at 12/23/17 1636  . insulin aspart (novoLOG) injection 0-15 Units  0-15 Units Subcutaneous TID WC Clapacs, Jackquline Denmark, MD   3 Units at 12/27/17 1150  . insulin detemir (LEVEMIR) injection 16 Units  16 Units Subcutaneous QHS Clapacs, Jackquline Denmark, MD   16 Units at 12/26/17 2129  . magnesium hydroxide (MILK OF MAGNESIA) suspension 30 mL  30 mL Oral Daily PRN Clapacs, John T, MD      . metFORMIN (GLUCOPHAGE) tablet 1,000 mg  1,000 mg Oral BID WC Clapacs, Jackquline Denmark, MD   1,000 mg at 12/27/17 0815  . pantoprazole (PROTONIX) EC tablet 40 mg  40 mg Oral Daily McNew, Ileene Hutchinson, MD   40 mg at 12/27/17 0815  . simvastatin (ZOCOR) tablet 10 mg  10 mg Oral Daily Clapacs, Jackquline Denmark, MD   10 mg at 12/27/17 0815  . traZODone (DESYREL) tablet 150 mg  150 mg Oral QHS McNew, Ileene Hutchinson, MD   150 mg at 12/26/17 2131    Lab Results:  Results for orders placed or performed during the hospital encounter of 12/22/17 (from the past 48 hour(s))  Glucose, capillary     Status: Abnormal   Collection Time: 12/25/17  4:31 PM  Result Value Ref Range   Glucose-Capillary 141 (H) 70 - 99 mg/dL   Comment 1 Notify RN   Glucose, capillary     Status: Abnormal   Collection Time: 12/25/17  9:35 PM  Result Value Ref Range   Glucose-Capillary 187 (H) 70 - 99 mg/dL  Glucose, capillary     Status: Abnormal   Collection Time: 12/26/17  7:00 AM  Result Value Ref Range   Glucose-Capillary 246 (H) 70 - 99 mg/dL  Glucose, capillary     Status: Abnormal   Collection Time: 12/26/17 11:31 AM  Result Value Ref Range   Glucose-Capillary 159 (H) 70 - 99 mg/dL  Glucose, capillary     Status: Abnormal   Collection Time: 12/26/17  4:08 PM  Result Value Ref  Range   Glucose-Capillary 164 (H) 70 - 99 mg/dL   Comment 1 Notify RN   Glucose, capillary     Status: Abnormal   Collection Time: 12/26/17  8:11 PM  Result Value Ref Range   Glucose-Capillary 145 (  H) 70 - 99 mg/dL   Comment 1 Notify RN   Glucose, capillary     Status: Abnormal   Collection Time: 12/27/17  7:03 AM  Result Value Ref Range   Glucose-Capillary 205 (H) 70 - 99 mg/dL   Comment 1 Notify RN   Glucose, capillary     Status: Abnormal   Collection Time: 12/27/17 11:16 AM  Result Value Ref Range   Glucose-Capillary 159 (H) 70 - 99 mg/dL    Blood Alcohol level:  Lab Results  Component Value Date   ETH <10 12/20/2017    Metabolic Disorder Labs: Lab Results  Component Value Date   HGBA1C 6.6 (H) 12/23/2017   MPG 142.72 12/23/2017   No results found for: PROLACTIN Lab Results  Component Value Date   CHOL 91 12/23/2017   TRIG 107 12/23/2017   HDL 40 (L) 12/23/2017   CHOLHDL 2.3 12/23/2017   VLDL 21 12/23/2017   LDLCALC 30 12/23/2017   LDLCALC 5 08/20/2017   Musculoskeletal: Strength & Muscle Tone: within normal limits Gait & Station: Unsteady, uses walker Patient leans: N/A  Psychiatric Specialty Exam: Physical Exam  Nursing note and vitals reviewed.   Review of Systems  All other systems reviewed and are negative.   Blood pressure 133/71, pulse 81, temperature 97.8 F (36.6 C), temperature source Oral, resp. rate 18, height 5\' 10"  (1.778 m), weight 81.6 kg, SpO2 97 %.Body mass index is 25.83 kg/m.  General Appearance: Casual  Eye Contact:  Good  Speech:  Clear and Coherent and Normal Rate  Volume:  Normal  Mood:  Depressed and Dysphoric  Affect:  Congruent, Constricted and Depressed  Thought Process:  Coherent, Goal Directed and Linear  Orientation:  Full (Time, Place, and Person)  Thought Content:  Logical  Suicidal Thoughts:  No  Homicidal Thoughts:  No  Memory:  Recent;   Fair  Judgement:  Fair  Insight:  Fair  Psychomotor Activity:  Normal   Concentration:  Concentration: Fair  Recall:  [employer] of Knowledge:  Fair  Language:  Fair  Akathisia:  No      Assets:  Communication Skills Desire for Improvement Housing Resilience  ADL's:  Intact  Cognition:  WNL  Sleep:  Number of Hours: 7.3     Treatment Plan Summary: 62 yo male admitted due to worsening depression and SI. Kamerin Axford reports improvement in mood and SI are resolving. Xavier Munger does have brighter affect today and is out of his room more.   Plan:  MDD -Continue Prozac 20 mg daily  Insomnia -Continue trazodone to 150 mg qhs  DM -Metformin 1000 mg BID -Levemir 16 units qhs -Sliding scale  Chronic pain -Gabapentin 1200 mg BID -prn Tylenol, Violia Knopf is warned about liver toxicity, as Breaunna Gottlieb stated Kendyll Huettner takes 3 tabs of 650mg  each at a time at home.  -Avoiding NSAIDS due to kidney function  EKG reviewed, labs reviewed  Dispo -Guerline Happ states that Gwendolin Briel can return to live with his brother. Likely discharge Monday    Ettamae Barkett, MD 12/27/2017, 2:25 PM

## 2017-12-28 LAB — GLUCOSE, CAPILLARY
GLUCOSE-CAPILLARY: 225 mg/dL — AB (ref 70–99)
Glucose-Capillary: 160 mg/dL — ABNORMAL HIGH (ref 70–99)

## 2017-12-28 MED ORDER — TRAZODONE HCL 150 MG PO TABS: 150.0000 mg | ORAL_TABLET | Freq: Every day | ORAL | 1 refills | Status: DC

## 2017-12-28 MED ORDER — FLUOXETINE HCL 20 MG PO CAPS: 20.0000 mg | ORAL_CAPSULE | Freq: Every day | ORAL | 1 refills | Status: DC

## 2017-12-28 NOTE — Progress Notes (Signed)
  [facility] Adult Case Management Discharge Plan :  Will you be returning to the same living situation after discharge:  Yes,    At discharge, do you have transportation home?: Yes,    Do you have the ability to pay for your medications: Yes,     Release of information consent forms completed and in the chart;  Patient's signature needed at discharge.  Patient to Follow up at: Follow-up Information    [facility] Go on 12/30/2017.   Why:  7:15am meet Mr. Unk Pinto  [phone] for Peer support services with [facility] for hospital follow up Contact information: [address] [facility] [address] [phone]           Next level of care provider has access to [facility] Link:no  Safety Planning and Suicide Prevention discussed: Yes,     Have you used any form of tobacco in the last 30 days? (Cigarettes, Smokeless Tobacco, Cigars, and/or Pipes): Yes  Has patient been referred to the Quitline?: Patient refused referral  Patient has been referred for addiction treatment: Yes  Glennon Mac, LCSW 12/28/2017, 2:11 PM

## 2017-12-28 NOTE — Plan of Care (Addendum)
Patient found in day room upon my arrival. Patient is visible and social this evening. Patient mood and affect appear brighter. Reports some improvement. Denies SI/HI/AVH. Reports eating and voiding adequately CBG 132, 16 units scheduled Levemir administered. Patient is pleasant and cooperative with assessment. Complains of back pain, given Tylenol with positive results. Compliant with HS medication and staff direction. Q 15 minute checks maintained. Will continue to monitor throughout the shift. Patient slept 7.25 hours. No apparent distress. Patient compliant with am vitals. VSS. Currently in day room socializing with peer. CBG 225, given 5 units coverage. Will endorse care to oncoming shift.  Problem: Education: Goal: Knowledge of [place] [facility] Education information/materials will improve Outcome: Progressing Goal: Emotional status will improve Outcome: Progressing Goal: Mental status will improve Outcome: Progressing Goal: Verbalization of understanding the information provided will improve Outcome: Progressing   Problem: Education: Goal: Ability to describe self-care measures that may prevent or decrease complications (Diabetes Survival Skills Education) will improve Outcome: Progressing   Problem: Coping: Goal: Ability to identify and develop effective coping behavior will improve Outcome: Progressing Goal: Ability to interact with others will improve Outcome: Progressing Goal: Ability to use eye contact when communicating with others will improve Outcome: Progressing   Problem: Health Behavior/Discharge Planning: Goal: Compliance with therapeutic regimen will improve Outcome: Progressing

## 2017-12-28 NOTE — Progress Notes (Signed)
Recreation Therapy Notes  INPATIENT RECREATION TR PLAN  Patient Details Name: Jishnu Jenniges MRN: [id number] DOB: 04/07/1956 Today's Date: 12/28/2017  Rec Therapy Plan Is patient appropriate for Therapeutic Recreation?: Yes Treatment times per week: at least 3 Estimated Length of Stay: 5-7 days TR Treatment/Interventions: Group participation (Comment)  Discharge Criteria Pt will be discharged from therapy if:: Discharged Treatment plan/goals/alternatives discussed and agreed upon by:: Patient/family  Discharge Summary Short term goals set: Patient will engage in groups with a calm and appropriate mood at least 2x within 5 recreation therapy group sessions Short term goals met: Not met Reason goals not met: Patient did not attend any groups Therapeutic equipment acquired:  N/A Reason patient discharged from therapy: Discharge from hospital Pt/family agrees with progress & goals achieved: Yes Date patient discharged from therapy: 12/28/17   Roneka Gilpin 12/28/2017, 1:02 PM
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
## 2017-12-28 NOTE — [facility] Suicide Risk Assessment (Signed)
[facility] Discharge Suicide Risk Assessment   Principal Problem: Major depressive disorder, recurrent episode, severe (HCC) Discharge Diagnoses:  Patient Active Problem List   Diagnosis Date Noted  . Major depressive disorder, recurrent episode, severe (HCC) [F33.2] 12/21/2017    Priority: High  . Suicidal ideation [R45.851] 12/21/2017  . URI (upper respiratory infection) [J06.9] 04/30/2017  . Hypertension [I10] 09/04/2016  . Segmental and somatic dysfunction of lumbar region [M99.03] 06/11/2016  . Sciatica, left side [M54.32] 06/11/2016  . Segmental and somatic dysfunction of thoracic region [M99.02] 06/11/2016  . Muscle spasm of back [M62.830] 06/11/2016  . Spinal stenosis [M48.00] 06/05/2016  . Hyperlipidemia [E78.5] 04/19/2015  . Diabetes mellitus without complication (HCC) [E11.9] 12/16/2014      Mental Status Per Nursing Assessment::   On Admission:  NA  Demographic Factors:  Male, Caucasian and Unemployed  Loss Factors: Decrease in vocational status  Historical Factors: NA  Risk Reduction Factors:   Living with another person, especially a relative, Positive social support, Positive therapeutic relationship and Positive coping skills or problem solving skills  Continued Clinical Symptoms:  Previous Psychiatric Diagnoses and Treatments  Cognitive Features That Contribute To Risk:  None    Suicide Risk:  Minimal: No identifiable suicidal ideation.   Follow-up Information    [facility] Follow up.   Why:  TBD Contact information: [address] [place] [address] [phone]            Haskell Riling, MD 12/28/2017, 11:46 AM

## 2017-12-28 NOTE — Progress Notes (Signed)
Patient alert and oriented x 4. Ambulates unit with steady gait. Verbally denies SI/HI/AVH and pain. Patient discharged on above date and time. Verbalized understanding the discharge information provided to patient upon discharge. Patient departed unit with discharge paperwork, prescriptions and personal belongings. Picked up by his brother to follow up with outpatient services.

## 2017-12-28 NOTE — [facility] Suicide Risk Assessment (Signed)
[facility] INPATIENT:  Family/Significant Other Suicide Prevention Education  Suicide Prevention Education:  Education Completed; Brodus Locke, Brother [phone],  (name of family member/significant other) has been identified by the patient as the family member/significant other with whom the patient will be residing, and identified as the person(s) who will aid the patient in the event of a mental health crisis (suicidal ideations/suicide attempt).  With written consent from the patient, the family member/significant other has been provided the following suicide prevention education, prior to the and/or following the discharge of the patient.  The suicide prevention education provided includes the following:  Suicide risk factors  Suicide prevention and interventions  National Suicide Hotline telephone number  [facility] assessment telephone number  [facility] Emergency Assistance 911  [facility] and/or Residential Mobile Crisis Unit telephone number  Request made of family/significant other to:  Remove weapons (e.g., guns, rifles, knives), all items previously/currently identified as safety concern.    Remove drugs/medications (over-the-counter, prescriptions, illicit drugs), all items previously/currently identified as a safety concern.  The family member/significant other verbalizes understanding of the suicide prevention education information provided.  The family member/significant other agrees to remove the items of safety concern listed above.  Cleda Daub Hrithik Boschee, LCSW 12/28/2017, 11:35 AM

## 2018-01-05 ENCOUNTER — Ambulatory Visit: Payer: Self-pay | Admitting: Family Medicine

## 2018-01-05 VITALS — BP 117/71 | HR 84 | Temp 97.7°F | Ht 68.75 in | Wt 191.6 lb

## 2018-01-05 DIAGNOSIS — E1142 Type 2 diabetes mellitus with diabetic polyneuropathy: Secondary | ICD-10-CM

## 2018-01-05 DIAGNOSIS — E785 Hyperlipidemia, unspecified: Secondary | ICD-10-CM

## 2018-01-05 DIAGNOSIS — Z09 Encounter for follow-up examination after completed treatment for conditions other than malignant neoplasm: Secondary | ICD-10-CM

## 2018-01-05 DIAGNOSIS — G629 Polyneuropathy, unspecified: Secondary | ICD-10-CM

## 2018-01-05 DIAGNOSIS — F325 Major depressive disorder, single episode, in full remission: Secondary | ICD-10-CM

## 2018-01-05 DIAGNOSIS — Z794 Long term (current) use of insulin: Secondary | ICD-10-CM

## 2018-01-05 DIAGNOSIS — J302 Other seasonal allergic rhinitis: Secondary | ICD-10-CM

## 2018-01-05 DIAGNOSIS — Z0000 Encounter for general adult medical examination without abnormal findings: Secondary | ICD-10-CM

## 2018-01-05 MED ORDER — FEXOFENADINE HCL 180 MG PO TABS: 180.0000 mg | ORAL_TABLET | Freq: Every day | ORAL | 1 refills | Status: DC

## 2018-01-05 MED ORDER — ACETAMINOPHEN ER 650 MG PO TBCR: EXTENDED_RELEASE_TABLET | ORAL | 6 refills | Status: AC

## 2018-01-05 MED ORDER — GABAPENTIN 400 MG PO CAPS: 1200.0000 mg | ORAL_CAPSULE | Freq: Two times a day (BID) | ORAL | 3 refills | Status: DC

## 2018-01-05 NOTE — Progress Notes (Signed)
Hospital Follow Up  Subjective:    Patient ID: Jorge Olson, male    DOB: [birth year], 62 y.o.   MRN: [id number]   Chief Complaint  Patient presents with  . Follow-up    flu shot, pneumovox, blood work   HPI  Mr. Elick is a 62 year old male with a past medical history of Hypertension, Hyperlipidemia, and Diabetes. He is here today for Hospital Follow Up.   Current Status: Since her last office visit, he is doing well with no complaints. He was recently admitted on 12/20/2017 for Suicidal Ideation. He states that he is doing well with no complaints. He denies suicidal ideations, homicidal ideations, or auditory hallucinations. He was also seen for and treated for hyperglycemia at that time.   He is currently followed by [facility], where he sees a psychiatrist for counseling and medications.   He denies fevers, chills, fatigue, recent infections, weight loss, and night sweats. He has not had any headaches, visual changes, dizziness, and falls. No chest pain, heart palpitations, cough and shortness of breath reported. No reports of GI problems such as nausea, vomiting, diarrhea, and constipation. He has no reports of blood in stools, dysuria and hematuria.  He denies pain today.   Past Medical History:  Diagnosis Date  . Diabetes mellitus without complication (HCC)   . Hyperlipidemia   . Hypertension 09/04/2016    Family History  Problem Relation Age of Onset  . Diabetes Mellitus II Mother   . Diabetes Mellitus II Father   . Diabetes Mellitus II Sister   . Diabetes Mellitus II Sister     Social History   Socioeconomic History  . Marital status: Single    Spouse name: Not on file  . Number of children: Not on file  . Years of education: Not on file  . Highest education level: Not on file  Occupational History  . Not on file  Social Needs  . Financial resource strain: Very hard  . Food insecurity:    Worry: Sometimes true    Inability: Sometimes true  . Transportation  needs:    Medical: No    Non-medical: No  Tobacco Use  . Smoking status: Current Every Day Smoker    Packs/day: 1.00    Types: Cigarettes  . Smokeless tobacco: Never Used  . Tobacco comment: Smokes less than a pack a day.  Substance and Sexual Activity  . Alcohol use: No  . 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 Sexual activity: Not on file  Lifestyle  . Physical activity:    Days per week: 0 days    Minutes per session: Not on file  . Stress: Very much  Relationships  . Social connections:    Talks on phone: Three times a week    Gets together: Never    Attends religious service: More than 4 times per year    Active member of club or organization: Yes    Attends meetings of clubs or organizations: Not on file    Relationship status: Separated  . Intimate partner violence:    Fear of current or ex partner: Not on file    Emotionally abused: Not on file    Physically abused: Not on file    Forced sexual activity: Not on file  Other Topics Concern  . Not on file  Social History Narrative   Patient does not have a partner.    History reviewed. No pertinent  surgical history.  Immunization History  Administered Date(s) Administered  . Influenza Inj Mdck Quad Pf 02/21/2016  . Influenza-Unspecified 03/20/2015, 02/05/2017    Current Meds  Medication Sig  . acetaminophen (TYLENOL) 650 MG CR tablet Take as needed.  Marland Kitchen albuterol (PROVENTIL HFA;VENTOLIN HFA) 108 (90 Base) MCG/ACT inhaler Inhale 1-2 puffs into the lungs every 6 (six) hours as needed for wheezing or shortness of breath.  Marland Kitchen aspirin 81 MG tablet Take 81 mg by mouth daily.  . fexofenadine (ALLEGRA) 180 MG tablet Take 1 tablet (180 mg total) by mouth daily.  Marland Kitchen FLUoxetine (PROZAC) 20 MG capsule Take 1 capsule (20 mg total) by mouth daily.  Marland Kitchen gabapentin (NEURONTIN) 400 MG capsule Take 3 capsules (1,200 mg total) by mouth 2 (two) times daily.  . insulin detemir (LEVEMIR) 100 UNIT/ML  injection INJECT 16 UNITS UNDER THE SKIN EVERY DAY AT BEDTIME  . liraglutide (VICTOZA) 18 MG/3ML SOPN Inject 1.2 mg into the skin daily.  . metFORMIN (GLUCOPHAGE) 1000 MG tablet TAKE 1 TABLET BY MOUTH 2 TIMES A DAY. (Patient taking differently: 1,000 mg 2 (two) times daily with a meal. TAKE 1 TABLET BY MOUTH 2 TIMES A DAY.)  . naproxen (NAPROSYN) 500 MG tablet TAKE 1 TABLET BY MOUTH 2 TIMES A DAY WITH A MEAL. (Patient taking differently: Take 500 mg by mouth 2 (two) times daily. )  . omeprazole (PRILOSEC) 20 MG capsule TAKE ONE CAPSULE BY MOUTH EVERY DAY  . simvastatin (ZOCOR) 10 MG tablet Take 1 tablet (10 mg total) by mouth daily. Reported on 07/31/2015  . traZODone (DESYREL) 150 MG tablet Take 1 tablet (150 mg total) by mouth at bedtime.  . [DISCONTINUED] acetaminophen (TYLENOL) 650 MG CR tablet as needed. Take 3 tablets (1950MG ) by mouth every morning and 2 tablets (1300MG ) by mouth every evening  . [DISCONTINUED] fexofenadine (ALLEGRA) 180 MG tablet Take 1 tablet (180 mg total) by mouth daily.  . [DISCONTINUED] gabapentin (NEURONTIN) 400 MG capsule Take 3 capsules (1,200 mg total) by mouth 2 (two) times daily.   Current Facility-Administered Medications for the 01/05/18 encounter (Office Visit) with Kallie Locks, FNP  Medication  . ondansetron (ZOFRAN-ODT) disintegrating tablet 4 mg   No Known Allergies  BP 117/71 (BP Location: Left Arm)   Pulse 84   Temp 97.7 F (36.5 C)   Ht 5' 8.75" (1.746 m)   Wt 191 lb 9.6 oz (86.9 kg)   BMI 28.50 kg/m   Review of Systems  Constitutional: Negative.   HENT: Negative.   Eyes: Negative.   Respiratory: Negative.   Cardiovascular: Negative.   Gastrointestinal: Negative.   Endocrine: Negative.   Genitourinary: Negative.   Musculoskeletal: Negative.   Skin: Negative.   Allergic/Immunologic: Negative.        Seasonal Allergies   Neurological: Negative.   Hematological: Negative.   Psychiatric/Behavioral: Negative.     Objective:    Physical Exam  Constitutional: He is oriented to person, place, and time. He appears well-developed and well-nourished.  HENT:  Head: Normocephalic and atraumatic.  Right Ear: External ear normal.  Left Ear: External ear normal.  Nose: Nose normal.  Mouth/Throat: Oropharynx is clear and moist.  Eyes: Pupils are equal, round, and reactive to light. Conjunctivae and EOM are normal.  Neck: Normal range of motion. Neck supple.  Cardiovascular: Normal rate, regular rhythm, normal heart sounds and intact distal pulses.  Pulmonary/Chest: Effort normal and breath sounds normal.  Abdominal: Soft. Bowel sounds are normal.  Musculoskeletal: Normal range of motion.  Neurological: He is alert and oriented to person, place, and time.  Skin: Skin is warm and dry. Capillary refill takes less than 2 seconds.  Psychiatric: He has a normal mood and affect. His behavior is normal. Judgment and thought content normal.  Nursing note and vitals reviewed.  Assessment & Plan:   1. Hospital discharge follow-up  2. Type 2 diabetes mellitus with diabetic polyneuropathy, with long-term current use of insulin (HCC) - gabapentin (NEURONTIN) 400 MG capsule; Take 3 capsules (1,200 mg total) by mouth 2 (two) times daily.  Dispense: 180 capsule; Refill: 3  3. Neuropathy Refill Gabapentin today.  - gabapentin (NEURONTIN) 400 MG capsule; Take 3 capsules (1,200 mg total) by mouth 2 (two) times daily.  Dispense: 180 capsule; Refill: 3  4. Essential hypertension Blood pressure is 117/71 today. We will continue to monitor. He will continue to decrease high sodium intake, excessive alcohol intake, increase potassium intake, smoking cessation, and increase physical activity of at least 30 minutes of cardio activity daily. He will continue to follow Heart Healthy or DASH diet.   5. Healthcare maintenance - HgB A1c - Comprehensive metabolic panel - CBC w/Diff  6. Hyperlipidemia, unspecified hyperlipidemia type Recent  Lipid Panel on 12/23/2017 was stable.    7. Seasonal allergies Stable today. We will refill Allegra today.   8. Follow up He will follow up 2 months.   Meds ordered this encounter  Medications  . fexofenadine (ALLEGRA) 180 MG tablet    Sig: Take 1 tablet (180 mg total) by mouth daily.    Dispense:  90 tablet    Refill:  1  . acetaminophen (TYLENOL) 650 MG CR tablet    Sig: Take as needed.    Dispense:  60 tablet    Refill:  6  . gabapentin (NEURONTIN) 400 MG capsule    Sig: Take 3 capsules (1,200 mg total) by mouth 2 (two) times daily.    Dispense:  180 capsule    Refill:  3    Raliegh Ip,  MSN, FNP-C [facility] [address] [place], [place] [address] [phone]

## 2018-01-06 LAB — CBC WITH DIFFERENTIAL/PLATELET
Basophils Absolute: 0 10*3/uL (ref 0.0–0.2)
Basos: 0 %
EOS (ABSOLUTE): 0.3 10*3/uL (ref 0.0–0.4)
Eos: 3 %
Hematocrit: 42.7 % (ref 37.5–51.0)
Hemoglobin: 14.4 g/dL (ref 13.0–17.7)
Immature Grans (Abs): 0 10*3/uL (ref 0.0–0.1)
Immature Granulocytes: 0 %
Lymphocytes Absolute: 2.6 10*3/uL (ref 0.7–3.1)
Lymphs: 30 %
MCH: 31.9 pg (ref 26.6–33.0)
MCHC: 33.7 g/dL (ref 31.5–35.7)
MCV: 95 fL (ref 79–97)
Monocytes Absolute: 0.4 10*3/uL (ref 0.1–0.9)
Monocytes: 5 %
Neutrophils Absolute: 5.5 10*3/uL (ref 1.4–7.0)
Neutrophils: 62 %
Platelets: 224 10*3/uL (ref 150–450)
RBC: 4.52 x10E6/uL (ref 4.14–5.80)
RDW: 13.1 % (ref 12.3–15.4)
WBC: 8.9 10*3/uL (ref 3.4–10.8)

## 2018-01-06 LAB — COMPREHENSIVE METABOLIC PANEL
ALT: 22 IU/L (ref 0–44)
AST: 18 IU/L (ref 0–40)
Albumin/Globulin Ratio: 2.4 — ABNORMAL HIGH (ref 1.2–2.2)
Albumin: 4.4 g/dL (ref 3.6–4.8)
Alkaline Phosphatase: 69 IU/L (ref 39–117)
BUN/Creatinine Ratio: 12 (ref 10–24)
BUN: 18 mg/dL (ref 8–27)
Bilirubin Total: 0.8 mg/dL (ref 0.0–1.2)
CO2: 26 mmol/L (ref 20–29)
Calcium: 9.5 mg/dL (ref 8.6–10.2)
Chloride: 109 mmol/L — ABNORMAL HIGH (ref 96–106)
Creatinine, Ser: 1.52 mg/dL — ABNORMAL HIGH (ref 0.76–1.27)
GFR calc Af Amer: 57 mL/min/{1.73_m2} — ABNORMAL LOW (ref >59)
GFR calc non Af Amer: 49 mL/min/{1.73_m2} — ABNORMAL LOW (ref >59)
Globulin, Total: 1.8 g/dL (ref 1.5–4.5)
Glucose: 88 mg/dL (ref 65–99)
Potassium: 4.7 mmol/L (ref 3.5–5.2)
Sodium: 149 mmol/L — ABNORMAL HIGH (ref 134–144)
Total Protein: 6.2 g/dL (ref 6.0–8.5)

## 2018-01-06 LAB — HEMOGLOBIN A1C
Est. average glucose Bld gHb Est-mCnc: 151 mg/dL
Hgb A1c MFr Bld: 6.9 % — ABNORMAL HIGH (ref 4.8–5.6)

## 2018-01-12 ENCOUNTER — Other Ambulatory Visit: Payer: Self-pay | Admitting: Internal Medicine

## 2018-01-12 DIAGNOSIS — Z794 Long term (current) use of insulin: Principal | ICD-10-CM

## 2018-01-12 DIAGNOSIS — E1142 Type 2 diabetes mellitus with diabetic polyneuropathy: Secondary | ICD-10-CM

## 2018-02-08 ENCOUNTER — Other Ambulatory Visit: Payer: Self-pay | Admitting: Adult Health Nurse Practitioner

## 2018-02-08 ENCOUNTER — Other Ambulatory Visit: Payer: Self-pay | Admitting: Internal Medicine

## 2018-02-08 DIAGNOSIS — E1142 Type 2 diabetes mellitus with diabetic polyneuropathy: Secondary | ICD-10-CM

## 2018-02-08 DIAGNOSIS — Z794 Long term (current) use of insulin: Principal | ICD-10-CM

## 2018-02-09 ENCOUNTER — Other Ambulatory Visit: Payer: Self-pay | Admitting: Internal Medicine

## 2018-02-10 ENCOUNTER — Telehealth: Payer: Self-pay

## 2018-02-10 NOTE — Telephone Encounter (Signed)
Pt came in to make an appt. Pt stated his "sugars are running low and has to be seen he does not know what's going on". We did not have anything available today we made him an appt for tmw at 1:30. WE advised pt to go to the ED if he feels he needs further medical attention. He told me is sugar was "104 this morning without breakfast and 70 yesterday afternoon". I advised pt to go eat breakfast and drink some oj to get that sugar up and to take it easy today and to bring his meter to his appt tmw. Pt verbalized understanding.

## 2018-02-11 ENCOUNTER — Ambulatory Visit: Payer: Self-pay | Admitting: Adult Health Nurse Practitioner

## 2018-02-11 ENCOUNTER — Ambulatory Visit: Payer: Self-pay | Admitting: Adult Health

## 2018-02-11 DIAGNOSIS — Z794 Long term (current) use of insulin: Principal | ICD-10-CM

## 2018-02-11 DIAGNOSIS — E1142 Type 2 diabetes mellitus with diabetic polyneuropathy: Secondary | ICD-10-CM

## 2018-02-11 MED ORDER — LISINOPRIL 10 MG PO TABS: 10.0000 mg | ORAL_TABLET | Freq: Every day | ORAL | 3 refills | Status: DC

## 2018-02-11 MED ORDER — FEXOFENADINE HCL 180 MG PO TABS: 180.0000 mg | ORAL_TABLET | Freq: Every day | ORAL | 1 refills | Status: DC

## 2018-02-11 NOTE — Progress Notes (Signed)
Subjective:    Patient ID: Jorge Olson, male    DOB: 10/31/1955, 62 y.o.   MRN: [id number]  HPI  Jorge Olson is a 62 yo M here for f/u of Type 2 DM, Neuropathy, HTN.  DM: Pt reports his Blood Glucose has been running 70-145 and is symptomatic for hypoglycemia. HTN: BP elevated at 142/72. He reports he is no longer able to get Lisinopril 10mg . Depression: Pt was rx Prozac 20mg  and Desyrel 150mg  in the hospital. He is going to [facility]. He denies intent to self-harm or harm others.  Seasonal allergies: Pt reports his Allegra was d/c  Patient Active Problem List   Diagnosis Date Noted  . Major depressive disorder, recurrent episode, severe (HCC) 12/21/2017  . Suicidal ideation 12/21/2017  . URI (upper respiratory infection) 04/30/2017  . Hypertension 09/04/2016  . Segmental and somatic dysfunction of lumbar region 06/11/2016  . Sciatica, left side 06/11/2016  . Segmental and somatic dysfunction of thoracic region 06/11/2016  . Muscle spasm of back 06/11/2016  . Spinal stenosis 06/05/2016  . Hyperlipidemia 04/19/2015  . Diabetes mellitus without complication (HCC) 12/16/2014   Allergies as of 02/11/2018   No Known Allergies     Medication List        Accurate as of 02/11/18  6:42 PM. Always use your most recent med list.          acetaminophen 650 MG CR tablet Commonly known as:  TYLENOL Take as needed.   albuterol 108 (90 Base) MCG/ACT inhaler Commonly known as:  PROVENTIL HFA;VENTOLIN HFA Inhale 1-2 puffs into the lungs every 6 (six) hours as needed for wheezing or shortness of breath.   aspirin 81 MG tablet Take 81 mg by mouth daily.   fexofenadine 180 MG tablet Commonly known as:  ALLEGRA Take 1 tablet (180 mg total) by mouth daily.   FLUoxetine 20 MG capsule Commonly known as:  PROZAC Take 1 capsule (20 mg total) by mouth daily.   gabapentin 400 MG capsule Commonly known as:  NEURONTIN TAKE TWO CAPSULES BY MOUTH 3 TIMES A DAY   insulin detemir 100  UNIT/ML injection Commonly known as:  LEVEMIR INJECT 16 UNITS UNDER THE SKIN EVERY DAY AT BEDTIME   liraglutide 18 MG/3ML Sopn Commonly known as:  VICTOZA Inject 1.2 mg into the skin daily.   metFORMIN 1000 MG tablet Commonly known as:  GLUCOPHAGE TAKE 1 TABLET BY MOUTH 2 TIMES A DAY.   naproxen 500 MG tablet Commonly known as:  NAPROSYN Take 1 tablet (500 mg total) by mouth 2 (two) times daily.   omeprazole 20 MG capsule Commonly known as:  PRILOSEC TAKE ONE CAPSULE BY MOUTH EVERY DAY   simvastatin 10 MG tablet Commonly known as:  ZOCOR TAKE ONE TABLET BY MOUTH EVERY DAY   traZODone 150 MG tablet Commonly known as:  DESYREL Take 1 tablet (150 mg total) by mouth at bedtime.        Review of Systems  All other systems reviewed and are negative.  Last A1C 53mo ago was 6.9    Objective:   Physical Exam  Constitutional: He is oriented to person, place, and time. He appears well-developed and well-nourished.  Cardiovascular: Normal rate, regular rhythm and normal heart sounds.  Pulmonary/Chest: Effort normal and breath sounds normal.  Abdominal: Soft. Bowel sounds are normal.  Neurological: He is alert and oriented to person, place, and time.  Skin: Skin is warm and dry.  Psychiatric: He has a normal mood and affect.  His behavior is normal. Judgment and thought content normal.  Vitals reviewed.   BP (!) 142/72   Pulse 72   Temp 97.9 F (36.6 C)   Ht 5' 8.3" (1.735 m)   Wt 190 lb (86.2 kg)   BMI 28.64 kg/m         Assessment & Plan:   Type 2 DM: Hold Victoza because of Hypoglycemia. Switch to taking Levemir in the morning. Continue Metformin as rx.   Depression: Continue to see [facility] for treatment.   HTN: Restart Lisinopril 10mg   Seasonal allergies: Restart Allegra.   Keep scheduled appointment on 11/14.

## 2018-02-23 ENCOUNTER — Telehealth: Payer: Self-pay | Admitting: Pharmacist

## 2018-02-23 NOTE — Telephone Encounter (Signed)
02/23/2018 11:09:08 AM - Novo Nordisk rewewal-Levemir,Victoza&tips  02/23/18 Patient enrollment with [employer] ended 12/03/17=Printed renewal applications for Levemir Vials Inject 16 units under the skin daily at bedtime #3, Victoza Inject 1.2mg  into the skin every morning #4, Novofine 32G tips Use daily with Victoza #2--mailing patient his portion to sign & return also Novo Medicaid form, also sending provider portion to [facility].Jorge Olson

## 2018-03-11 ENCOUNTER — Ambulatory Visit: Payer: Self-pay | Admitting: Internal Medicine

## 2018-03-11 VITALS — BP 138/66 | HR 79 | Temp 98.0°F | Ht 70.0 in | Wt 182.6 lb

## 2018-03-11 DIAGNOSIS — F339 Major depressive disorder, recurrent, unspecified: Secondary | ICD-10-CM

## 2018-03-11 DIAGNOSIS — Z794 Long term (current) use of insulin: Principal | ICD-10-CM

## 2018-03-11 DIAGNOSIS — E1142 Type 2 diabetes mellitus with diabetic polyneuropathy: Secondary | ICD-10-CM

## 2018-03-11 MED ORDER — INSULIN DETEMIR 100 UNIT/ML ~~LOC~~ SOLN: 18.0000 [IU] | Freq: Every day | SUBCUTANEOUS | 11 refills | Status: DC

## 2018-03-11 MED ORDER — TRAZODONE HCL 150 MG PO TABS: 150.0000 mg | ORAL_TABLET | Freq: Every day | ORAL | 1 refills | Status: DC

## 2018-03-11 MED ORDER — FLUOXETINE HCL 20 MG PO CAPS: 20.0000 mg | ORAL_CAPSULE | Freq: Every day | ORAL | 1 refills | Status: DC

## 2018-03-11 NOTE — Progress Notes (Signed)
Patient: Jorge Olson Male    DOB: Nov 17, 1955   62 y.o.   MRN: [id number] Visit Date: 03/11/2018  Today's Provider: Isa Rankin, MD   Chief Complaint  Patient presents with  . Hyperglycemia    Knows his blood sugars have been inconsistent   Subjective:    HPI Followup/blood sugars. Patient is currently homeless, staying with a friend, not eating right/eating a lot of carbs/bread.  Food stamps pending, looking forward to getting more nutritious foods.   At last visit in 12/2017, A1c was 6.9 and  victoza was d/c'ed and levemir 16 units was changed from qhs to qam.  Now with sugars on glucometer mostly 200s, worried.   In midst of disability application for back injury 2012, has been denied a couple times. On prozac and trazodone for depression, not suicidal.  Has followup with behavioral health 12/4, out of meds and would like refilled to get to that appt.     No Known Allergies Previous Medications   ACETAMINOPHEN (TYLENOL) 650 MG CR TABLET    Take as needed.   ALBUTEROL (PROVENTIL HFA;VENTOLIN HFA) 108 (90 BASE) MCG/ACT INHALER    Inhale 1-2 puffs into the lungs every 6 (six) hours as needed for wheezing or shortness of breath.   ASPIRIN 81 MG TABLET    Take 81 mg by mouth daily.   FEXOFENADINE (ALLEGRA) 180 MG TABLET    Take 1 tablet (180 mg total) by mouth daily.   GABAPENTIN (NEURONTIN) 400 MG CAPSULE    TAKE TWO CAPSULES BY MOUTH 3 TIMES A DAY   LIRAGLUTIDE (VICTOZA) 18 MG/3ML SOPN    Inject 1.2 mg into the skin daily.   LISINOPRIL (PRINIVIL,ZESTRIL) 10 MG TABLET    Take 1 tablet (10 mg total) by mouth daily.   METFORMIN (GLUCOPHAGE) 1000 MG TABLET    TAKE 1 TABLET BY MOUTH 2 TIMES A DAY.   NAPROXEN (NAPROSYN) 500 MG TABLET    Take 1 tablet (500 mg total) by mouth 2 (two) times daily.   OMEPRAZOLE (PRILOSEC) 20 MG CAPSULE    TAKE ONE CAPSULE BY MOUTH EVERY DAY   SIMVASTATIN (ZOCOR) 10 MG TABLET    TAKE ONE TABLET BY MOUTH EVERY DAY    Review of Systems  All other  systems reviewed and are negative.   Social History   Tobacco Use  . Smoking status: Current Every Day Smoker    Packs/day: 1.00    Types: Cigarettes  . Smokeless tobacco: Never Used  . Tobacco comment: Smokes less than a pack a day.  Substance Use Topics  . Alcohol use: No   Objective:   BP 138/66   Pulse 79   Temp 98 F (36.7 C)   Ht 5\' 10"  (1.778 m)   Wt 182 lb 9.6 oz (82.8 kg)   BMI 26.20 kg/m   Physical Exam  Constitutional: He is oriented to person, place, and time. No distress.  Alert, nicely groomed  HENT:  Head: Atraumatic.  Eyes:  Conjugate gaze, no eye redness/drainage  Neck: Neck supple.  Cardiovascular: Normal rate and regular rhythm.  Pulmonary/Chest: No respiratory distress.  Lungs clear, symmetric breath sounds  Abdominal: He exhibits no distension.  Musculoskeletal: Normal range of motion. He exhibits no edema.  Neurological: He is alert and oriented to person, place, and time.  Skin: Skin is warm and dry.  No cyanosis  Nursing note and vitals reviewed.       Assessment & Plan:     Diabetes:  Loss of glucose control, based on life stress/diet change/glucometer readings.  Check HbA1c before followup in about a month. Change levemir back to nightime, increase to 18 units.  Continue metformin.  Hold off on victoza for now.  Followup in 4 weeks to review HbA1c/discuss any further med adjustments.  Depression:  Appointment on 12/4.  Needs prozac 20 and trazodone 150 refilled to get him til appt.  Not suicidal.    HTN:  Good blood pressure control.      Isa RankinLaura Wilson Omario Ander, MD   [facility]

## 2018-03-11 NOTE — Patient Instructions (Addendum)
Diabetes:  Loss of glucose control.  Check HbA1c before followup in about a month. Change levemir back to nightime, increase to 18 units.  Continue metformin.  Hold off on victoza for now.  Followup in 4 weeks to review HbA1c/discuss any further med adjustments.  Depression:  Appointment on 12/4.  Needs prozac 20 and trazodone 150 refilled to get til appt.  Not suicidal.    HTN:  Good blood pressure control.

## 2018-03-12 ENCOUNTER — Other Ambulatory Visit: Payer: Self-pay | Admitting: Internal Medicine

## 2018-03-12 ENCOUNTER — Telehealth: Payer: Self-pay | Admitting: Pharmacist

## 2018-03-12 DIAGNOSIS — Z794 Long term (current) use of insulin: Principal | ICD-10-CM

## 2018-03-12 DIAGNOSIS — E1142 Type 2 diabetes mellitus with diabetic polyneuropathy: Secondary | ICD-10-CM

## 2018-03-12 NOTE — Telephone Encounter (Signed)
03/12/2018 12:28:04 PM - Levemir vials, Victoza & tips pending  03/12/18 I have received the signed provider portion back for Levemir Vials, Victoza & tips, holding for patient to return his portion-mailed to patient 02/23/18.Forde RadonAJ

## 2018-03-15 ENCOUNTER — Telehealth: Payer: Self-pay | Admitting: Pharmacist

## 2018-03-15 NOTE — Telephone Encounter (Signed)
03/15/2018 9:00:11 AM - [employer] renewal for meds  03/15/18 [employer] ExpressFaxed Novo Nordisk renewal application for [employer] AutoLevemir Vial Inject 16 units under the skin daily at bedtime #2; Victoza Inject 1.2mg  into the skin every morning #4; Novofine 32G tips Use daily with Victoza pens #200.Forde RadonAJ

## 2018-04-01 ENCOUNTER — Other Ambulatory Visit: Payer: Self-pay

## 2018-04-02 LAB — SPECIMEN STATUS REPORT

## 2018-04-05 LAB — HEMOGLOBIN A1C

## 2018-04-05 LAB — SPECIMEN STATUS REPORT

## 2018-04-08 ENCOUNTER — Ambulatory Visit: Payer: Self-pay | Admitting: Adult Health Nurse Practitioner

## 2018-04-08 VITALS — BP 156/80 | HR 87 | Temp 98.1°F | Ht 70.0 in | Wt 184.1 lb

## 2018-04-08 DIAGNOSIS — R1084 Generalized abdominal pain: Secondary | ICD-10-CM

## 2018-04-08 DIAGNOSIS — E119 Type 2 diabetes mellitus without complications: Secondary | ICD-10-CM

## 2018-04-08 DIAGNOSIS — I10 Essential (primary) hypertension: Secondary | ICD-10-CM

## 2018-04-08 DIAGNOSIS — R109 Unspecified abdominal pain: Secondary | ICD-10-CM | POA: Insufficient documentation

## 2018-04-08 MED ORDER — OMEPRAZOLE 20 MG PO CPDR: 20.0000 mg | DELAYED_RELEASE_CAPSULE | Freq: Every day | ORAL | 3 refills | Status: DC

## 2018-04-08 NOTE — Progress Notes (Signed)
Patient: Jorge Olson Male    DOB: December 22, 1955   62 y.o.   MRN: [id number] Visit Date: 04/08/2018  Today's Provider: Jacelyn Pi, NP   Chief Complaint  Patient presents with  . Results    A1C  . Abdominal Pain    Diarrhea   Subjective:    HPI  Pt states that he had diarrhea x 1 day but for the past two days he has had abdominal pain and he cannot breathe deeply due to the pain. Also cannot eat large quantities.   Has been off Prilosec- x 4 days.   A1c was not done at lab due to specimen no being submitted.  Is taking Levemir 18 units and off Victoza at this time.  Sugars averaging 80s-140s.   No Known Allergies Previous Medications   ACETAMINOPHEN (TYLENOL) 650 MG CR TABLET    Take as needed.   ALBUTEROL (PROVENTIL HFA;VENTOLIN HFA) 108 (90 BASE) MCG/ACT INHALER    Inhale 1-2 puffs into the lungs every 6 (six) hours as needed for wheezing or shortness of breath.   ASPIRIN 81 MG TABLET    Take 81 mg by mouth daily.   FEXOFENADINE (ALLEGRA) 180 MG TABLET    Take 1 tablet (180 mg total) by mouth daily.   FLUOXETINE (PROZAC) 20 MG CAPSULE    Take 1 capsule (20 mg total) by mouth daily.   GABAPENTIN (NEURONTIN) 400 MG CAPSULE    TAKE TWO CAPSULES BY MOUTH 3 TIMES A DAY   INSULIN DETEMIR (LEVEMIR) 100 UNIT/ML INJECTION    Inject 0.18 mLs (18 Units total) into the skin at bedtime.   LIRAGLUTIDE (VICTOZA) 18 MG/3ML SOPN    Inject 1.2 mg into the skin daily.   LISINOPRIL (PRINIVIL,ZESTRIL) 10 MG TABLET    Take 1 tablet (10 mg total) by mouth daily.   METFORMIN (GLUCOPHAGE) 1000 MG TABLET    TAKE 1 TABLET BY MOUTH 2 TIMES A DAY.   NAPROXEN (NAPROSYN) 500 MG TABLET    TAKE ONE TABLET BY MOUTH 2 TIMES A DAY   OMEPRAZOLE (PRILOSEC) 20 MG CAPSULE    TAKE ONE CAPSULE BY MOUTH EVERY DAY   SIMVASTATIN (ZOCOR) 10 MG TABLET    TAKE ONE TABLET BY MOUTH EVERY DAY   TRAZODONE (DESYREL) 150 MG TABLET    Take 1 tablet (150 mg total) by mouth at bedtime.    Review of Systems  All other  systems reviewed and are negative.   Social History   Tobacco Use  . Smoking status: Current Every Day Smoker    Packs/day: 1.00    Types: Cigarettes  . Smokeless tobacco: Never Used  . Tobacco comment: Smokes less than a pack a day.  Substance Use Topics  . Alcohol use: No   Objective:   BP (!) 156/80   Pulse 87   Temp 98.1 F (36.7 C)   Ht 5\' 10"  (1.778 m)   Wt 184 lb 1.6 oz (83.5 kg)   BMI 26.42 kg/m   Physical Exam Vitals signs reviewed.  Constitutional:      Appearance: He is well-developed.  Cardiovascular:     Rate and Rhythm: Normal rate and regular rhythm.  Pulmonary:     Effort: Pulmonary effort is normal.     Breath sounds: Normal breath sounds.  Abdominal:     General: Bowel sounds are normal.     Palpations: Abdomen is soft.     Tenderness: There is generalized abdominal tenderness.  Skin:    General:  Skin is warm and dry.  Neurological:     Mental Status: He is alert.         Assessment & Plan:        Will check CBC, CMET and A1c tonight.   Restart Prilosec. Take BID x 1 week then daily.   Continue Levemir as directed. Stay off Victoza.   Will repeat BP at next OV- last visit BP was WNL.   FU in 2-3 weeks for lab result   Jacelyn Pieah Doles-Johnson, NP   [facility]

## 2018-04-09 LAB — CBC
Hematocrit: 45.4 % (ref 37.5–51.0)
Hemoglobin: 15.6 g/dL (ref 13.0–17.7)
MCH: 32.6 pg (ref 26.6–33.0)
MCHC: 34.4 g/dL (ref 31.5–35.7)
MCV: 95 fL (ref 79–97)
PLATELETS: 180 10*3/uL (ref 150–450)
RBC: 4.79 x10E6/uL (ref 4.14–5.80)
RDW: 12.6 % (ref 12.3–15.4)
WBC: 10.1 10*3/uL (ref 3.4–10.8)

## 2018-04-09 LAB — HEMOGLOBIN A1C
ESTIMATED AVERAGE GLUCOSE: 128 mg/dL
Hgb A1c MFr Bld: 6.1 % — ABNORMAL HIGH (ref 4.8–5.6)

## 2018-04-09 LAB — COMPREHENSIVE METABOLIC PANEL
A/G RATIO: 2.7 — AB (ref 1.2–2.2)
ALT: 15 IU/L (ref 0–44)
AST: 18 IU/L (ref 0–40)
Albumin: 4.8 g/dL (ref 3.6–4.8)
Alkaline Phosphatase: 72 IU/L (ref 39–117)
BILIRUBIN TOTAL: 0.6 mg/dL (ref 0.0–1.2)
BUN/Creatinine Ratio: 18 (ref 10–24)
BUN: 31 mg/dL — ABNORMAL HIGH (ref 8–27)
CALCIUM: 10.2 mg/dL (ref 8.6–10.2)
CHLORIDE: 102 mmol/L (ref 96–106)
CO2: 25 mmol/L (ref 20–29)
Creatinine, Ser: 1.73 mg/dL — ABNORMAL HIGH (ref 0.76–1.27)
GFR calc Af Amer: 49 mL/min/{1.73_m2} — ABNORMAL LOW (ref >59)
GFR, EST NON AFRICAN AMERICAN: 42 mL/min/{1.73_m2} — AB (ref >59)
GLUCOSE: 94 mg/dL (ref 65–99)
Globulin, Total: 1.8 g/dL (ref 1.5–4.5)
POTASSIUM: 4.9 mmol/L (ref 3.5–5.2)
Sodium: 143 mmol/L (ref 134–144)
Total Protein: 6.6 g/dL (ref 6.0–8.5)

## 2018-04-09 LAB — AMYLASE: AMYLASE: 148 U/L — AB (ref 31–124)

## 2018-04-09 LAB — LIPASE: Lipase: 183 U/L — ABNORMAL HIGH (ref 13–78)

## 2018-04-19 ENCOUNTER — Other Ambulatory Visit: Payer: Self-pay | Admitting: Internal Medicine

## 2018-04-19 DIAGNOSIS — E1142 Type 2 diabetes mellitus with diabetic polyneuropathy: Secondary | ICD-10-CM

## 2018-04-19 DIAGNOSIS — Z794 Long term (current) use of insulin: Principal | ICD-10-CM

## 2018-04-29 ENCOUNTER — Ambulatory Visit: Payer: Self-pay

## 2018-04-30 ENCOUNTER — Other Ambulatory Visit: Payer: Self-pay | Admitting: Adult Health Nurse Practitioner

## 2018-05-03 ENCOUNTER — Other Ambulatory Visit: Payer: Self-pay | Admitting: Adult Health Nurse Practitioner

## 2018-05-03 ENCOUNTER — Other Ambulatory Visit: Payer: Self-pay | Admitting: Internal Medicine

## 2018-05-03 DIAGNOSIS — E1142 Type 2 diabetes mellitus with diabetic polyneuropathy: Secondary | ICD-10-CM

## 2018-05-03 DIAGNOSIS — Z794 Long term (current) use of insulin: Principal | ICD-10-CM

## 2018-05-04 ENCOUNTER — Other Ambulatory Visit: Payer: Self-pay

## 2018-05-04 DIAGNOSIS — E1142 Type 2 diabetes mellitus with diabetic polyneuropathy: Secondary | ICD-10-CM

## 2018-05-04 DIAGNOSIS — Z794 Long term (current) use of insulin: Principal | ICD-10-CM

## 2018-05-04 MED ORDER — SIMVASTATIN 10 MG PO TABS: 10.0000 mg | ORAL_TABLET | Freq: Every day | ORAL | 0 refills | Status: DC

## 2018-05-04 MED ORDER — TRAZODONE HCL 150 MG PO TABS: 150.0000 mg | ORAL_TABLET | Freq: Every day | ORAL | 1 refills | Status: DC

## 2018-05-06 ENCOUNTER — Ambulatory Visit: Payer: Self-pay | Admitting: Urology

## 2018-05-06 VITALS — BP 132/70 | HR 75 | Temp 97.8°F | Ht 70.5 in | Wt 189.9 lb

## 2018-05-06 DIAGNOSIS — Z794 Long term (current) use of insulin: Secondary | ICD-10-CM

## 2018-05-06 DIAGNOSIS — R109 Unspecified abdominal pain: Secondary | ICD-10-CM

## 2018-05-06 DIAGNOSIS — E1142 Type 2 diabetes mellitus with diabetic polyneuropathy: Secondary | ICD-10-CM

## 2018-05-06 MED ORDER — SERTRALINE HCL 100 MG PO TABS: 150.0000 mg | ORAL_TABLET | Freq: Every day | ORAL | 0 refills | Status: AC

## 2018-05-06 MED ORDER — METFORMIN HCL 500 MG PO TABS: 500.0000 mg | ORAL_TABLET | Freq: Two times a day (BID) | ORAL | 3 refills | Status: DC

## 2018-05-06 MED ORDER — SIMVASTATIN 10 MG PO TABS: 10.0000 mg | ORAL_TABLET | Freq: Every day | ORAL | 0 refills | Status: DC

## 2018-05-06 MED ORDER — ALBUTEROL SULFATE HFA 108 (90 BASE) MCG/ACT IN AERS: 1.0000 | INHALATION_SPRAY | Freq: Four times a day (QID) | RESPIRATORY_TRACT | 3 refills | Status: AC | PRN

## 2018-05-06 MED ORDER — TRAZODONE HCL 150 MG PO TABS: 150.0000 mg | ORAL_TABLET | Freq: Every day | ORAL | 1 refills | Status: DC

## 2018-05-06 MED ORDER — LISINOPRIL 10 MG PO TABS: 10.0000 mg | ORAL_TABLET | Freq: Every day | ORAL | 0 refills | Status: DC

## 2018-05-06 MED ORDER — GABAPENTIN 400 MG PO CAPS: ORAL_CAPSULE | ORAL | 0 refills | Status: DC

## 2018-05-06 MED ORDER — INSULIN DETEMIR 100 UNIT/ML ~~LOC~~ SOLN: 18.0000 [IU] | Freq: Every day | SUBCUTANEOUS | 11 refills | Status: DC

## 2018-05-06 NOTE — Progress Notes (Signed)
Patient: Jorge Olson Male    DOB: 12-04-1955   63 y.o.   MRN: [id number] Visit Date: 05/06/2018  Today's Provider: Michiel Cowboy, PA-C   Chief Complaint  Patient presents with  . Follow-up    blood sugar  . Back Pain   Subjective:    HPI Hbg A1c 6.1%     GFR 42 - will have him decrease his metformin to 500 mg bid   BS 115-250   No Known Allergies Previous Medications   ACETAMINOPHEN (TYLENOL) 650 MG CR TABLET    Take as needed.   ALBUTEROL (PROVENTIL HFA;VENTOLIN HFA) 108 (90 BASE) MCG/ACT INHALER    Inhale 1-2 puffs into the lungs every 6 (six) hours as needed for wheezing or shortness of breath.   ASPIRIN 81 MG TABLET    Take 81 mg by mouth daily.   FEXOFENADINE (ALLEGRA) 180 MG TABLET    Take 1 tablet (180 mg total) by mouth daily.   FLUOXETINE (PROZAC) 20 MG CAPSULE    Take 1 capsule (20 mg total) by mouth daily.   GABAPENTIN (NEURONTIN) 400 MG CAPSULE    TAKE TWO CAPSULES BY MOUTH 3 TIMES A DAY   INSULIN DETEMIR (LEVEMIR) 100 UNIT/ML INJECTION    Inject 0.18 mLs (18 Units total) into the skin at bedtime.   LIRAGLUTIDE (VICTOZA) 18 MG/3ML SOPN    Inject 1.2 mg into the skin daily.   LISINOPRIL (PRINIVIL,ZESTRIL) 10 MG TABLET    TAKE ONE TABLET BY MOUTH EVERY DAY   METFORMIN (GLUCOPHAGE) 1000 MG TABLET    TAKE 1 TABLET BY MOUTH 2 TIMES A DAY.   NAPROXEN (NAPROSYN) 500 MG TABLET    TAKE ONE TABLET BY MOUTH 2 TIMES A DAY   OMEPRAZOLE (PRILOSEC) 20 MG CAPSULE    Take 1 capsule (20 mg total) by mouth daily.   SERTRALINE (ZOLOFT) 100 MG TABLET    Take 150 mg by mouth daily.   SIMVASTATIN (ZOCOR) 10 MG TABLET    Take 1 tablet (10 mg total) by mouth daily.   TRAZODONE (DESYREL) 150 MG TABLET    Take 1 tablet (150 mg total) by mouth at bedtime.    Review of Systems  Constitutional: Negative.   HENT: Negative.   Eyes: Negative.   Respiratory: Negative.   Cardiovascular: Negative.   Gastrointestinal: Negative.   Endocrine: Negative.   Genitourinary: Negative.    Musculoskeletal: Negative.   Neurological: Negative.   Hematological: Negative.   Psychiatric/Behavioral: Negative.     Social History   Tobacco Use  . Smoking status: Current Every Day Smoker    Packs/day: 1.00    Types: Cigarettes  . Smokeless tobacco: Never Used  . Tobacco comment: Smokes less than a pack a day.  Substance Use Topics  . Alcohol use: No   Objective:   BP 132/70 (BP Location: Left Arm, Patient Position: Sitting, Cuff Size: Normal)   Pulse 75   Temp 97.8 F (36.6 C) (Oral)   Ht 5' 10.5" (1.791 m)   Wt 189 lb 14.4 oz (86.1 kg)   BMI 26.86 kg/m   Physical Exam Constitutional:      Appearance: Normal appearance. He is obese.  HENT:     Head: Normocephalic and atraumatic.     Nose: Nose normal.     Mouth/Throat:     Mouth: Mucous membranes are moist.  Eyes:     Extraocular Movements: Extraocular movements intact.     Conjunctiva/sclera: Conjunctivae normal.     Pupils: Pupils  are equal, round, and reactive to light.  Neck:     Musculoskeletal: Normal range of motion.  Cardiovascular:     Rate and Rhythm: Normal rate and regular rhythm.     Pulses: Normal pulses.     Heart sounds: Normal heart sounds.  Pulmonary:     Effort: Pulmonary effort is normal.     Breath sounds: Normal breath sounds.  Neurological:     Mental Status: He is alert.         Assessment & Plan:   1. DM  - renal function decreasing   - decrease metformin to 500 mg BID  - avoid nephrotoxic agents - stop naproxen   - Hgb A1c 6.1%    - given strips - continue to check sugars BID and record  - RTC in one month to review BS log and kidney function  2. Leg pain  - continue PT  3. Elevated creatinine  - check RUS  - recheck in one month  - stop NSAIDS  - decrease metformin         Michiel CowboySHANNON Yina Riviere, PA-C   [facility] [place]

## 2018-05-13 ENCOUNTER — Ambulatory Visit
Admission: RE | Admit: 2018-05-13 | Discharge: 2018-05-13 | Disposition: A | Discharge status: Home / Self Care | Payer: Medicaid Other | Source: Ambulatory Visit | Attending: Urology | Admitting: Urology

## 2018-05-13 DIAGNOSIS — R109 Unspecified abdominal pain: Secondary | ICD-10-CM | POA: Insufficient documentation

## 2018-05-20 ENCOUNTER — Other Ambulatory Visit: Payer: Self-pay | Admitting: Ophthalmology

## 2018-05-20 ENCOUNTER — Ambulatory Visit: Payer: Self-pay | Admitting: Ophthalmology

## 2018-05-20 LAB — HM DIABETES EYE EXAM

## 2018-05-27 ENCOUNTER — Other Ambulatory Visit: Payer: Self-pay

## 2018-05-27 DIAGNOSIS — Z794 Long term (current) use of insulin: Principal | ICD-10-CM

## 2018-05-27 DIAGNOSIS — E1142 Type 2 diabetes mellitus with diabetic polyneuropathy: Secondary | ICD-10-CM

## 2018-05-28 LAB — BASIC METABOLIC PANEL
BUN/Creatinine Ratio: 16 (ref 10–24)
BUN: 26 mg/dL (ref 8–27)
CO2: 21 mmol/L (ref 20–29)
Calcium: 9.7 mg/dL (ref 8.6–10.2)
Chloride: 102 mmol/L (ref 96–106)
Creatinine, Ser: 1.63 mg/dL — ABNORMAL HIGH (ref 0.76–1.27)
GFR calc Af Amer: 52 mL/min/{1.73_m2} — ABNORMAL LOW (ref >59)
GFR, EST NON AFRICAN AMERICAN: 45 mL/min/{1.73_m2} — AB (ref >59)
Glucose: 145 mg/dL — ABNORMAL HIGH (ref 65–99)
Potassium: 4.3 mmol/L (ref 3.5–5.2)
SODIUM: 140 mmol/L (ref 134–144)

## 2018-06-03 ENCOUNTER — Ambulatory Visit: Payer: Self-pay

## 2018-06-04 ENCOUNTER — Other Ambulatory Visit: Payer: Self-pay | Admitting: Urology

## 2018-06-04 DIAGNOSIS — Z794 Long term (current) use of insulin: Principal | ICD-10-CM

## 2018-06-04 DIAGNOSIS — E1142 Type 2 diabetes mellitus with diabetic polyneuropathy: Secondary | ICD-10-CM

## 2018-06-10 ENCOUNTER — Ambulatory Visit: Payer: Self-pay | Admitting: Adult Health Nurse Practitioner

## 2018-06-10 VITALS — BP 128/78 | HR 70 | Temp 98.7°F | Ht 70.0 in | Wt 194.9 lb

## 2018-06-10 DIAGNOSIS — E1142 Type 2 diabetes mellitus with diabetic polyneuropathy: Secondary | ICD-10-CM

## 2018-06-10 DIAGNOSIS — R3911 Hesitancy of micturition: Secondary | ICD-10-CM

## 2018-06-10 DIAGNOSIS — Z794 Long term (current) use of insulin: Secondary | ICD-10-CM

## 2018-06-10 NOTE — Progress Notes (Signed)
Patient: Jorge Olson Male    DOB: Dec 20, 1955   63 y.o.   MRN: [id number] Visit Date: 06/10/2018  Today's Provider: [facility]   Chief Complaint  Patient presents with  . Follow-up    kidney function   Subjective:    HPI   Here for RUS and lab FU.   Endorses hesitancy, frequency. Denies hematuria.   He quit smoking.   No Known Allergies Previous Medications   ACETAMINOPHEN (TYLENOL) 650 MG CR TABLET    Take as needed.   ALBUTEROL (PROVENTIL HFA;VENTOLIN HFA) 108 (90 BASE) MCG/ACT INHALER    Inhale 1-2 puffs into the lungs every 6 (six) hours as needed for wheezing or shortness of breath.   ASPIRIN 81 MG TABLET    Take 81 mg by mouth daily.   FEXOFENADINE (ALLEGRA) 180 MG TABLET    Take 1 tablet (180 mg total) by mouth daily.   FLUOXETINE (PROZAC) 20 MG CAPSULE    Take 1 capsule (20 mg total) by mouth daily.   GABAPENTIN (NEURONTIN) 400 MG CAPSULE    TAKE TWO CAPSULES BY MOUTH 3 TIMES A DAY   INSULIN DETEMIR (LEVEMIR) 100 UNIT/ML INJECTION    Inject 0.18 mLs (18 Units total) into the skin at bedtime.   LIRAGLUTIDE (VICTOZA) 18 MG/3ML SOPN    Inject 1.2 mg into the skin daily.   LISINOPRIL (PRINIVIL,ZESTRIL) 10 MG TABLET    Take 1 tablet (10 mg total) by mouth daily.   METFORMIN (GLUCOPHAGE) 500 MG TABLET    Take 1 tablet (500 mg total) by mouth 2 (two) times daily with a meal.   NAPROXEN (NAPROSYN) 500 MG TABLET    TAKE ONE TABLET BY MOUTH 2 TIMES A DAY   OMEPRAZOLE (PRILOSEC) 20 MG CAPSULE    Take 1 capsule (20 mg total) by mouth daily.   SERTRALINE (ZOLOFT) 100 MG TABLET    Take 1.5 tablets (150 mg total) by mouth daily.   SIMVASTATIN (ZOCOR) 10 MG TABLET    Take 1 tablet (10 mg total) by mouth daily.   TRAZODONE (DESYREL) 150 MG TABLET    Take 1 tablet (150 mg total) by mouth at bedtime.    Review of Systems  All other systems reviewed and are negative.   Social History   Tobacco Use  . Smoking status: Former Smoker    Packs/day: 1.00    Types:  Cigarettes    Last attempt to quit: 05/10/2018    Years since quitting: 0.0  . Smokeless tobacco: Never Used  . Tobacco comment: Smokes less than a pack a day.  Substance Use Topics  . Alcohol use: No   Objective:   BP 128/78 (BP Location: Left Arm, Patient Position: Sitting, Cuff Size: Normal)   Pulse 70   Temp 98.7 F (37.1 C)   Ht 5\' 10"  (1.778 m)   Wt 194 lb 14.4 oz (88.4 kg)   BMI 27.97 kg/m   Physical Exam Vitals signs reviewed.  Constitutional:      Appearance: Normal appearance.  Cardiovascular:     Rate and Rhythm: Normal rate and regular rhythm.  Pulmonary:     Effort: Pulmonary effort is normal.     Breath sounds: Normal breath sounds.  Abdominal:     General: Bowel sounds are normal.     Tenderness: There is no abdominal tenderness. There is no right CVA tenderness or left CVA tenderness.  Neurological:     Mental Status: He is alert.  Assessment & Plan:        Labs stable, slightly improved.  RUS negative.  Will order PSA tonight.   FU in 6 weeks for routine visit. Labs a week prior.    [facility]   [facility]

## 2018-06-11 LAB — PSA: PROSTATE SPECIFIC AG, SERUM: 0.8 ng/mL (ref 0.0–4.0)

## 2018-06-16 ENCOUNTER — Ambulatory Visit: Payer: Self-pay | Admitting: Physical Therapy

## 2018-06-29 ENCOUNTER — Telehealth: Payer: Self-pay | Admitting: Pharmacist

## 2018-06-29 NOTE — Telephone Encounter (Signed)
06/29/2018 4:05:39 PM - Refills-Victoza, tips, & Levemir vials  06/29/2018 Printed [employer] refill request Victoza pens Inject 1.2mg  once daily #4, Levemir Vials Inject 16 units at bedtime # 3, Novofine 32G.Forde Radon

## 2018-07-05 ENCOUNTER — Other Ambulatory Visit: Payer: Self-pay | Admitting: Internal Medicine

## 2018-07-05 DIAGNOSIS — E1142 Type 2 diabetes mellitus with diabetic polyneuropathy: Secondary | ICD-10-CM

## 2018-07-05 DIAGNOSIS — Z794 Long term (current) use of insulin: Principal | ICD-10-CM

## 2018-07-14 ENCOUNTER — Encounter (INDEPENDENT_AMBULATORY_CARE_PROVIDER_SITE_OTHER): Payer: Self-pay

## 2018-07-15 ENCOUNTER — Other Ambulatory Visit: Payer: Self-pay

## 2018-07-15 DIAGNOSIS — E1142 Type 2 diabetes mellitus with diabetic polyneuropathy: Secondary | ICD-10-CM

## 2018-07-15 DIAGNOSIS — M67431 Ganglion, right wrist: Secondary | ICD-10-CM

## 2018-07-15 DIAGNOSIS — Z794 Long term (current) use of insulin: Secondary | ICD-10-CM

## 2018-07-16 LAB — HEMOGLOBIN A1C
Est. average glucose Bld gHb Est-mCnc: 154 mg/dL
Hgb A1c MFr Bld: 7 % — ABNORMAL HIGH (ref 4.8–5.6)

## 2018-07-22 ENCOUNTER — Ambulatory Visit: Payer: Self-pay | Admitting: Adult Health Nurse Practitioner

## 2018-07-22 DIAGNOSIS — E1142 Type 2 diabetes mellitus with diabetic polyneuropathy: Secondary | ICD-10-CM

## 2018-07-22 DIAGNOSIS — Z794 Long term (current) use of insulin: Principal | ICD-10-CM

## 2018-07-22 MED ORDER — SIMVASTATIN 10 MG PO TABS: 10.0000 mg | ORAL_TABLET | Freq: Every day | ORAL | 3 refills | Status: DC

## 2018-07-22 MED ORDER — OMEPRAZOLE 20 MG PO CPDR: 20.0000 mg | DELAYED_RELEASE_CAPSULE | Freq: Every day | ORAL | 3 refills | Status: DC

## 2018-07-22 MED ORDER — GABAPENTIN 400 MG PO CAPS: ORAL_CAPSULE | ORAL | 2 refills | Status: DC

## 2018-07-22 MED ORDER — METFORMIN HCL 500 MG PO TABS: 500.0000 mg | ORAL_TABLET | Freq: Two times a day (BID) | ORAL | 3 refills | Status: DC

## 2018-07-22 MED ORDER — INSULIN DETEMIR 100 UNIT/ML ~~LOC~~ SOLN: 18.0000 [IU] | Freq: Every day | SUBCUTANEOUS | 11 refills | Status: DC

## 2018-07-22 MED ORDER — LISINOPRIL 10 MG PO TABS: 10.0000 mg | ORAL_TABLET | Freq: Every day | ORAL | 1 refills | Status: DC

## 2018-07-22 NOTE — Progress Notes (Signed)
  Patient: Jorge Olson Male    DOB: [birth year]   63 y.o.   MRN: [id number] Visit Date: 07/22/2018  Today's Provider: [facility]   No chief complaint on file.  Subjective:    HPI   Telephonic visit.   A1c is up to 7 from 6.1.  Taking medications as directed.   Unable to tolerate Cymbalta- made his "skin crawl" went back on Zoloft.     No Known Allergies Previous Medications   ACETAMINOPHEN (TYLENOL) 650 MG CR TABLET    Take as needed.   ALBUTEROL (PROVENTIL HFA;VENTOLIN HFA) 108 (90 BASE) MCG/ACT INHALER    Inhale 1-2 puffs into the lungs every 6 (six) hours as needed for wheezing or shortness of breath.   ASPIRIN 81 MG TABLET    Take 81 mg by mouth daily.   FEXOFENADINE (ALLEGRA) 180 MG TABLET    Take 1 tablet (180 mg total) by mouth daily.   FLUOXETINE (PROZAC) 20 MG CAPSULE    Take 1 capsule (20 mg total) by mouth daily.   GABAPENTIN (NEURONTIN) 400 MG CAPSULE    TAKE TWO CAPSULES BY MOUTH 3 TIMES A DAY   INSULIN DETEMIR (LEVEMIR) 100 UNIT/ML INJECTION    Inject 0.18 mLs (18 Units total) into the skin at bedtime.   LIRAGLUTIDE (VICTOZA) 18 MG/3ML SOPN    Inject 1.2 mg into the skin daily.   LISINOPRIL (PRINIVIL,ZESTRIL) 10 MG TABLET    Take 1 tablet (10 mg total) by mouth daily.   METFORMIN (GLUCOPHAGE) 500 MG TABLET    Take 1 tablet (500 mg total) by mouth 2 (two) times daily with a meal.   NAPROXEN (NAPROSYN) 500 MG TABLET    TAKE ONE TABLET BY MOUTH 2 TIMES A DAY   OMEPRAZOLE (PRILOSEC) 20 MG CAPSULE    Take 1 capsule (20 mg total) by mouth daily.   SERTRALINE (ZOLOFT) 100 MG TABLET    Take 1.5 tablets (150 mg total) by mouth daily.   SIMVASTATIN (ZOCOR) 10 MG TABLET    Take 1 tablet (10 mg total) by mouth daily.   TRAZODONE (DESYREL) 150 MG TABLET    Take 1 tablet (150 mg total) by mouth at bedtime.    Review of Systems  All other systems reviewed and are negative.   Social History   Tobacco Use  . Smoking status: Former Smoker    Packs/day: 1.00     Types: Cigarettes    Last attempt to quit: 05/10/2018    Years since quitting: 0.2  . Smokeless tobacco: Never Used  . Tobacco comment: Smokes less than a pack a day.  Substance Use Topics  . Alcohol use: No   Objective:   There were no vitals taken for this visit.  Physical Exam  No PE.     Assessment & Plan:      Continue current gabapentin does- MAX dose.  Will recheck kidney function in 1 month.  Discussed that this is the max dose.   DM:  Controlled. Encourage diabetic diet and exercise.  Continue current medication regimen.   Continue to FU with [facility] for Psych Routine FU in 3 months.        [facility]   [facility]

## 2018-07-28 ENCOUNTER — Ambulatory Visit: Payer: Self-pay | Admitting: Surgery

## 2018-08-02 ENCOUNTER — Other Ambulatory Visit: Payer: Self-pay

## 2018-08-02 ENCOUNTER — Encounter: Payer: Self-pay | Admitting: Surgery

## 2018-08-02 ENCOUNTER — Ambulatory Visit (INDEPENDENT_AMBULATORY_CARE_PROVIDER_SITE_OTHER): Payer: Medicaid Other | Admitting: Surgery

## 2018-08-02 VITALS — BP 130/78 | HR 72 | Temp 97.5°F | Ht 68.0 in | Wt 184.0 lb

## 2018-08-02 DIAGNOSIS — M67431 Ganglion, right wrist: Secondary | ICD-10-CM

## 2018-08-02 NOTE — Progress Notes (Signed)
Patient ID: Jorge Olson, male   DOB: 12-23-1955, 63 y.o.   MRN: [id number]  HPI Jorge Olson is a 63 y.o. male in at the request of MRS.  Doles-Johnson NP.  For several last few weeks and had a lump on his anterior aspect of the right wrist.  He also reports that apparently he injured himself with a hot utensil.  After that he saw a lump.  This lump is tender to palpation.  He experiences intermittent mild sharp pains.  Worsening with certain movements.  He does have a history of rheumatoid arthritis and that also affects him on a daily basis.  No fevers no chills no cough and no other symptoms. Able to perform more than 4 METS of activity without any shortness of breath or chest pain.  He is diabetic but is controlled on medications.  face to face encounter  HPI  Past Medical History:  Diagnosis Date  . Diabetes mellitus without complication (HCC)   . Hyperlipidemia   . Hypertension 09/04/2016    History reviewed. No pertinent surgical history.  Family History  Problem Relation Age of Onset  . Diabetes Mellitus II Mother   . Diabetes Mellitus II Father   . Diabetes Mellitus II Sister   . Diabetes Mellitus II Sister     Social History Social History   Tobacco Use  . Smoking status: Former Smoker    Packs/day: 1.00    Types: Cigarettes    Last attempt to quit: 05/10/2018    Years since quitting: 0.2  . Smokeless tobacco: Never Used  . Tobacco comment: Smokes less than a pack a day.  Substance Use Topics  . Alcohol use: No  . 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 Cymbalta [Duloxetine Hcl] Itching    Current Outpatient Medications  Medication Sig Dispense Refill  . acetaminophen (TYLENOL) 650 MG CR tablet Take as needed. 60 tablet 6  . albuterol (PROVENTIL HFA;VENTOLIN HFA) 108 (90 Base) MCG/ACT inhaler Inhale 1-2 puffs into the lungs every 6 (six) hours as needed for wheezing or shortness  of breath. 1 Inhaler 3  . aspirin 81 MG tablet Take 81 mg by mouth daily.    Marland Kitchen gabapentin (NEURONTIN) 400 MG capsule Take 4 tablets twice daily. 240 capsule 2  . insulin detemir (LEVEMIR) 100 UNIT/ML injection Inject 0.18 mLs (18 Units total) into the skin at bedtime. 10 mL 11  . lisinopril (PRINIVIL,ZESTRIL) 10 MG tablet Take 1 tablet (10 mg total) by mouth daily. 90 tablet 1  . metFORMIN (GLUCOPHAGE) 500 MG tablet Take 1 tablet (500 mg total) by mouth 2 (two) times daily with a meal. 60 tablet 3  . omeprazole (PRILOSEC) 20 MG capsule Take 1 capsule (20 mg total) by mouth daily. 30 capsule 3  . sertraline (ZOLOFT) 100 MG tablet Take 1.5 tablets (150 mg total) by mouth daily. (Patient taking differently: Take 100 mg by mouth daily. Taking 2 by mouth in the morning) 30 tablet 0  . simvastatin (ZOCOR) 10 MG tablet Take 1 tablet (10 mg total) by mouth daily. 60 tablet 3  . traZODone (DESYREL) 150 MG tablet Take 1 tablet (150 mg total) by mouth at bedtime. 30 tablet 1   Current Facility-Administered Medications  Medication Dose Route Frequency Provider Last Rate Last Dose  . ondansetron (ZOFRAN-ODT) disintegrating tablet 4 mg  4 mg Oral Q8H PRN Odem, Deeann Cree, FNP  Review of Systems Full ROS  was asked and was negative except for the information on the HPI  Physical Exam Blood pressure 130/78, pulse 72, temperature (!) 97.5 F (36.4 C), temperature source Skin, height 5\' 8"  (1.727 m), weight 184 lb (83.5 kg), SpO2 99 %. CONSTITUTIONAL: NAD, awake and alert EYES: Pupils are equal, round, a Sclera are non-icteric. MUSCULOSKELETAL:  There is bilobar cyst on palmar aspect of right wrist, soft, midly tender to palpation Normal muscle strength and tone. No cyanosis or edema.   SKIN: Turgor is good and there are no pathologic skin lesions or ulcers. NEUROLOGIC: Motor and sensation is grossly normal. Cranial nerves are grossly intact. PSYCH:  Oriented to person, place and time. Affect is  normal.  Data Reviewed  I have personally reviewed the patient's imaging, laboratory findings and medical records.    Assessment/Plan  -63 year-old male with classic symptoms consistent with ganglion cyst on his right ulnar and lateral aspect of the wrist.  Discussed with the patient in detail about the natural history of ganglion cyst and the variable outcomes after excision.  Given the epidemic of COVID-19 I do think that is in his best interest to do wathful waiting approach.  Currently he is minimally symptomatic and not causing any neurological sequela. He knows to Call us back in case symptoms worsens. Copy of this report was sent to the referring provider    Sterling Big, MD FACS General Surgeon 08/02/2018, 2:09 PM

## 2018-08-02 NOTE — Patient Instructions (Addendum)
Follow up as needed

## 2018-08-10 ENCOUNTER — Other Ambulatory Visit: Payer: Self-pay | Admitting: Internal Medicine

## 2018-08-18 ENCOUNTER — Other Ambulatory Visit: Payer: Self-pay

## 2018-08-19 ENCOUNTER — Other Ambulatory Visit: Payer: Self-pay

## 2018-08-19 DIAGNOSIS — F339 Major depressive disorder, recurrent, unspecified: Secondary | ICD-10-CM

## 2018-08-19 MED ORDER — TRAZODONE HCL 150 MG PO TABS: 150.0000 mg | ORAL_TABLET | Freq: Every day | ORAL | 1 refills | Status: AC

## 2018-09-22 ENCOUNTER — Other Ambulatory Visit: Payer: Self-pay

## 2018-10-18 ENCOUNTER — Telehealth: Payer: Self-pay | Admitting: Pharmacist

## 2018-10-18 NOTE — Telephone Encounter (Signed)
10/18/2018 9:50:52 AM - Levemir Vial refill  10/18/2018 Printed [employer] refill request for Levemir Vials-Inject 16 units daily at bedtime # 3, will take to [facility] for provider to sign.Delos Haring

## 2018-10-20 ENCOUNTER — Other Ambulatory Visit: Payer: Medicaid Other

## 2018-10-20 ENCOUNTER — Other Ambulatory Visit: Payer: Self-pay | Admitting: Adult Health Nurse Practitioner

## 2018-10-20 DIAGNOSIS — E1142 Type 2 diabetes mellitus with diabetic polyneuropathy: Secondary | ICD-10-CM

## 2018-10-20 DIAGNOSIS — Z794 Long term (current) use of insulin: Secondary | ICD-10-CM

## 2018-10-21 ENCOUNTER — Ambulatory Visit: Payer: Self-pay

## 2018-10-21 LAB — COMPREHENSIVE METABOLIC PANEL
ALT: 22 IU/L (ref 0–44)
AST: 14 IU/L (ref 0–40)
Albumin/Globulin Ratio: 2.4 — ABNORMAL HIGH (ref 1.2–2.2)
Albumin: 4.6 g/dL (ref 3.8–4.9)
Alkaline Phosphatase: 69 IU/L (ref 39–117)
BUN/Creatinine Ratio: 11 (ref 10–24)
BUN: 19 mg/dL (ref 8–27)
Bilirubin Total: 0.6 mg/dL (ref 0.0–1.2)
CO2: 25 mmol/L (ref 20–29)
Calcium: 9.9 mg/dL (ref 8.6–10.2)
Chloride: 105 mmol/L (ref 96–106)
Creatinine, Ser: 1.77 mg/dL — ABNORMAL HIGH (ref 0.76–1.27)
GFR calc Af Amer: 47 mL/min/{1.73_m2} — ABNORMAL LOW (ref >59)
GFR calc non Af Amer: 41 mL/min/{1.73_m2} — ABNORMAL LOW (ref >59)
Globulin, Total: 1.9 g/dL (ref 1.5–4.5)
Glucose: 135 mg/dL — ABNORMAL HIGH (ref 65–99)
Potassium: 5.9 mmol/L — ABNORMAL HIGH (ref 3.5–5.2)
Sodium: 141 mmol/L (ref 134–144)
Total Protein: 6.5 g/dL (ref 6.0–8.5)

## 2018-10-21 LAB — HEMOGLOBIN A1C
Est. average glucose Bld gHb Est-mCnc: 148 mg/dL
Hgb A1c MFr Bld: 6.8 % — ABNORMAL HIGH (ref 4.8–5.6)

## 2018-10-28 ENCOUNTER — Ambulatory Visit: Payer: Medicaid Other | Admitting: Adult Health Nurse Practitioner

## 2018-10-28 ENCOUNTER — Other Ambulatory Visit: Payer: Self-pay

## 2018-10-28 DIAGNOSIS — Z794 Long term (current) use of insulin: Secondary | ICD-10-CM

## 2018-10-28 DIAGNOSIS — E1142 Type 2 diabetes mellitus with diabetic polyneuropathy: Secondary | ICD-10-CM

## 2018-10-28 DIAGNOSIS — N183 Chronic kidney disease, stage 3 unspecified: Secondary | ICD-10-CM

## 2018-10-28 MED ORDER — LISINOPRIL 10 MG PO TABS: 10.0000 mg | ORAL_TABLET | Freq: Every day | ORAL | 1 refills | Status: AC

## 2018-10-28 MED ORDER — OMEPRAZOLE 20 MG PO CPDR: 20.0000 mg | DELAYED_RELEASE_CAPSULE | Freq: Every day | ORAL | 3 refills | Status: AC

## 2018-10-28 MED ORDER — GABAPENTIN 300 MG PO CAPS: ORAL_CAPSULE | ORAL | 1 refills | Status: AC

## 2018-10-28 MED ORDER — INSULIN DETEMIR 100 UNIT/ML ~~LOC~~ SOLN: 18.0000 [IU] | Freq: Every day | SUBCUTANEOUS | 11 refills | Status: AC

## 2018-10-28 MED ORDER — SIMVASTATIN 10 MG PO TABS: 10.0000 mg | ORAL_TABLET | Freq: Every day | ORAL | 3 refills | Status: AC

## 2018-10-28 MED ORDER — METFORMIN HCL 500 MG PO TABS: 500.0000 mg | ORAL_TABLET | Freq: Two times a day (BID) | ORAL | 3 refills | Status: AC

## 2018-10-28 NOTE — Progress Notes (Signed)
  Patient: Jorge Olson Male    DOB: [birth year]   63 y.o.   MRN: [id number] Visit Date: 10/28/2018  Today's Provider: Staci Acosta, NP   No chief complaint on file.  Subjective:    HPI  Telephonic visit.   DM: A1c is 6.8 from 7.   Slight increase in crt. 1.63 up to 1.77.  States he is taking OTC zinc, vitamin d and vitamin c.     Allergies  Allergen Reactions  . Cymbalta [Duloxetine Hcl] Itching   Previous Medications   ACETAMINOPHEN (TYLENOL) 650 MG CR TABLET    Take as needed.   ALBUTEROL (PROVENTIL HFA;VENTOLIN HFA) 108 (90 BASE) MCG/ACT INHALER    Inhale 1-2 puffs into the lungs every 6 (six) hours as needed for wheezing or shortness of breath.   ASPIRIN 81 MG TABLET    Take 81 mg by mouth daily.   GABAPENTIN (NEURONTIN) 400 MG CAPSULE    Take 4 tablets twice daily.   INSULIN DETEMIR (LEVEMIR) 100 UNIT/ML INJECTION    Inject 0.18 mLs (18 Units total) into the skin at bedtime.   LISINOPRIL (PRINIVIL,ZESTRIL) 10 MG TABLET    Take 1 tablet (10 mg total) by mouth daily.   METFORMIN (GLUCOPHAGE) 500 MG TABLET    Take 1 tablet (500 mg total) by mouth 2 (two) times daily with a meal.   OMEPRAZOLE (PRILOSEC) 20 MG CAPSULE    Take 1 capsule (20 mg total) by mouth daily.   SERTRALINE (ZOLOFT) 100 MG TABLET    Take 1.5 tablets (150 mg total) by mouth daily.   SIMVASTATIN (ZOCOR) 10 MG TABLET    Take 1 tablet (10 mg total) by mouth daily.   TRAZODONE (DESYREL) 150 MG TABLET    Take 1 tablet (150 mg total) by mouth at bedtime.    Review of Systems  All other systems reviewed and are negative.   Social History   Tobacco Use  . Smoking status: Former Smoker    Packs/day: 1.00    Types: Cigarettes    Quit date: 05/10/2018    Years since quitting: 0.4  . Smokeless tobacco: Never Used  . Tobacco comment: Smokes less than a pack a day.  Substance Use Topics  . Alcohol use: No   Objective:   There were no vitals taken for this visit.  Physical Exam  No PE.      Assessment & Plan:         DM:  Controlled.  Encourage diabetic diet and exercise.  Continue current medication regimen.   CKD3: Manage chronic conditions.  Increase water intake.  Limit nephrotoxic medications.  Decrease Gabapentin to 600 BID due to CrCl of 55.  Consider decreasing or eliminating Lisinopril when able to monitor BP.  Discussed limiting Tylenol use to <3g daily.    To establish care with new PCP.    Staci Acosta, NP   [facility]

## 2019-01-19 ENCOUNTER — Other Ambulatory Visit: Payer: Self-pay

## 2019-05-26 ENCOUNTER — Ambulatory Visit: Payer: Self-pay | Admitting: Ophthalmology

## 2019-07-20 ENCOUNTER — Other Ambulatory Visit: Payer: Self-pay | Admitting: Adult Health Nurse Practitioner

## 2019-07-21 ENCOUNTER — Other Ambulatory Visit: Payer: Self-pay | Admitting: Adult Health Nurse Practitioner

## 2020-06-13 IMAGING — US US RENAL
1 series · 14 of 25 positions shown · non-contrast
Comparison: None.

CLINICAL DATA: Flank pain

EXAM:
RENAL / URINARY TRACT ULTRASOUND COMPLETE

[Series 1: us renal · 0.22mm/px · 14 of 27 slices shown]
[im 1/27]
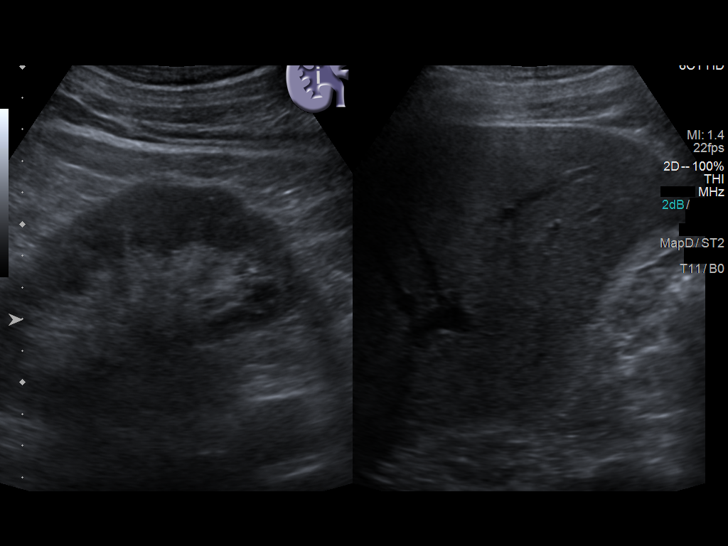
[im 3/27]
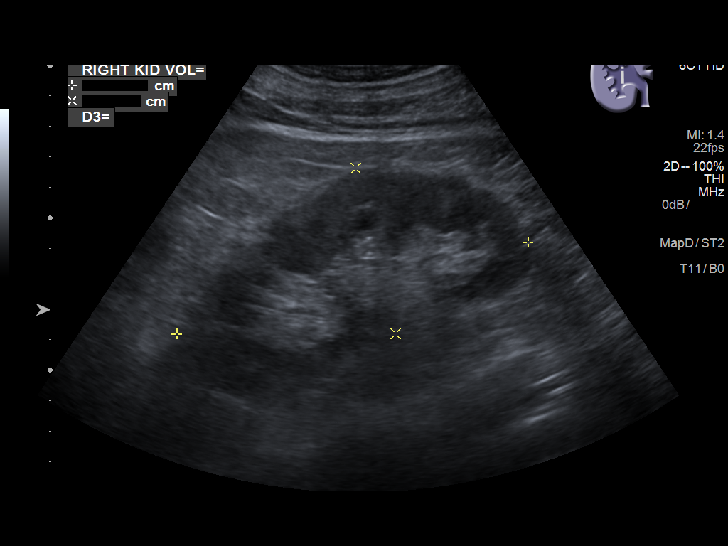
[im 5/27]
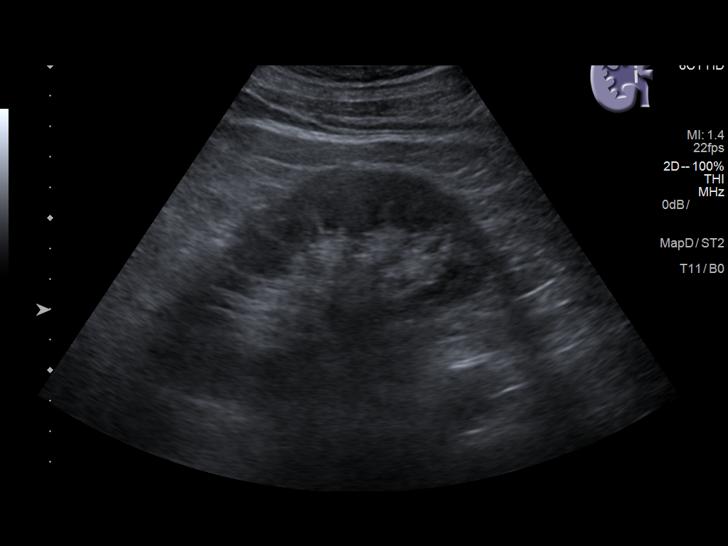
[im 7/27]
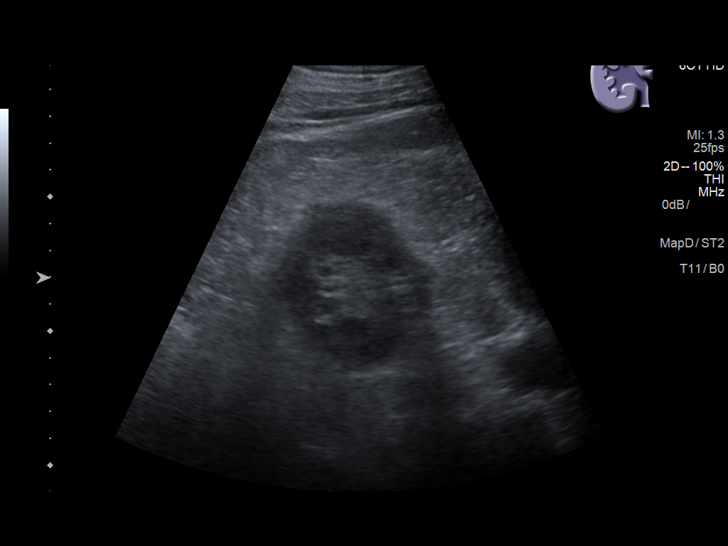
[im 9/27]
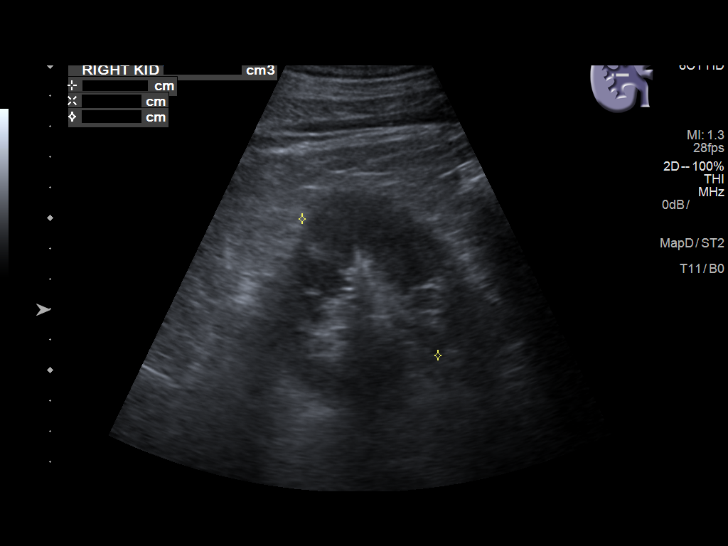
[im 10/27]
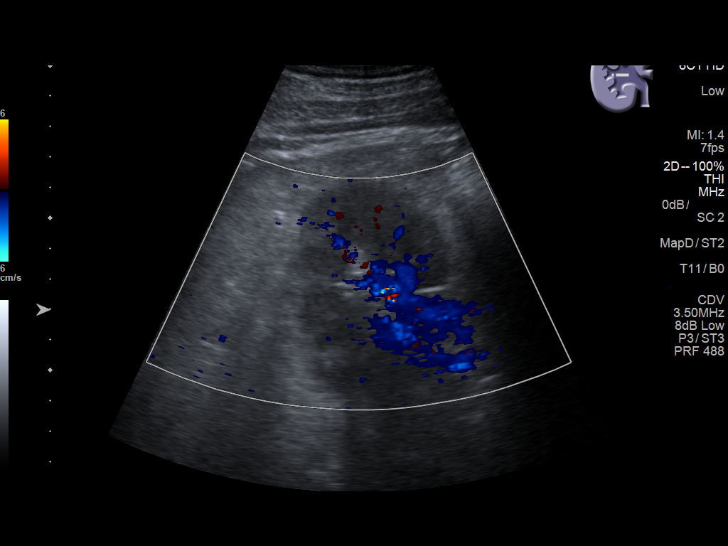
[im 12/27]
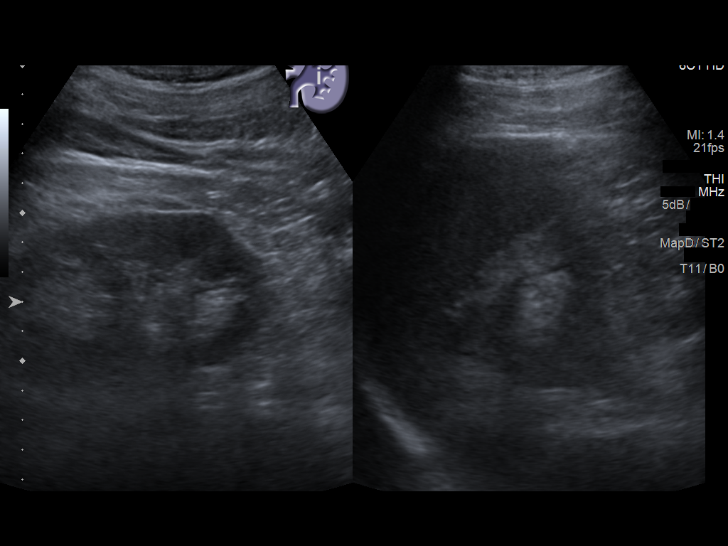
[im 15/27]
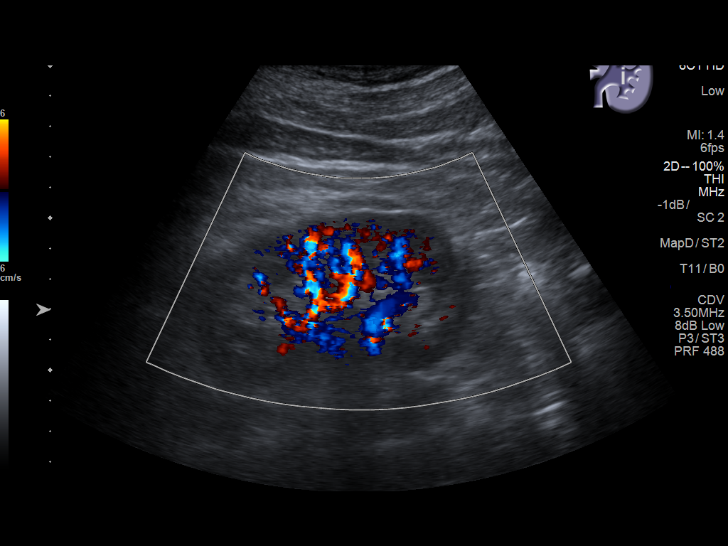
[im 17/27]
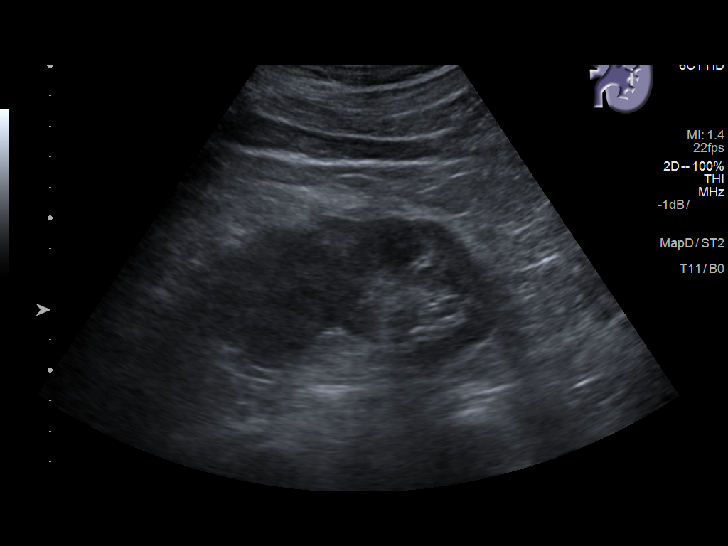
[im 18/27]
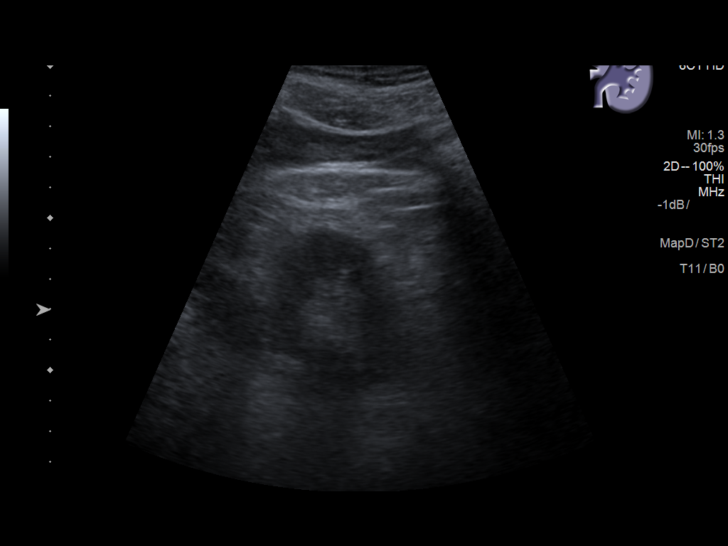
[im 20/27]
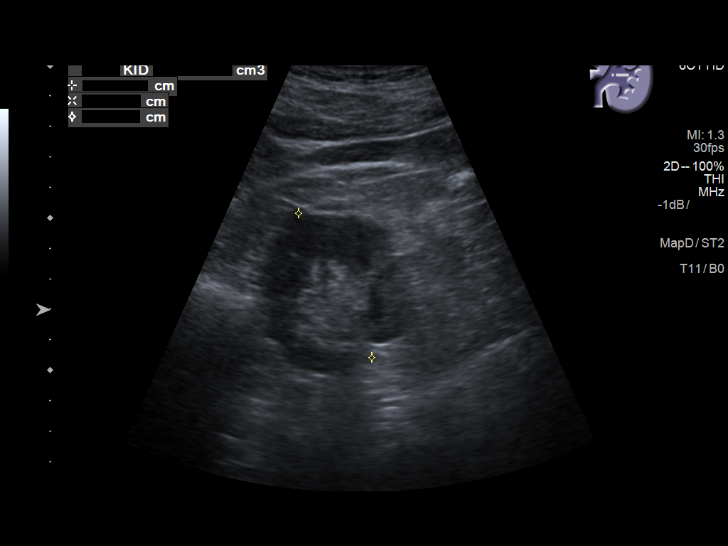
[im 22/27]
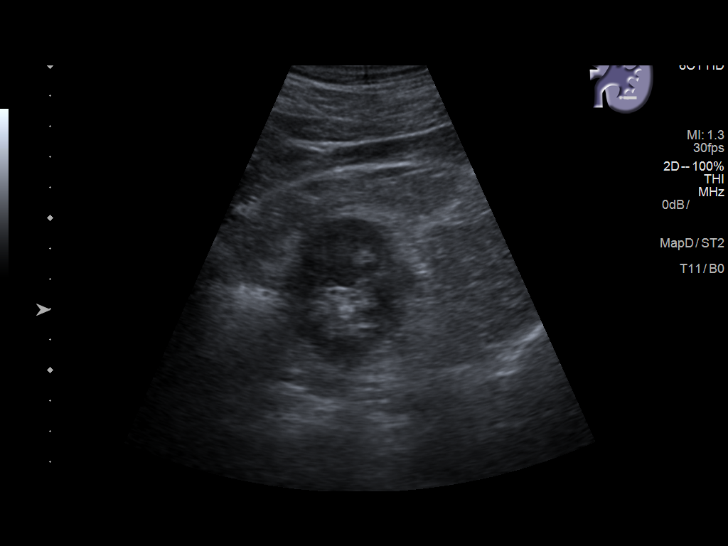
[im 24/27]
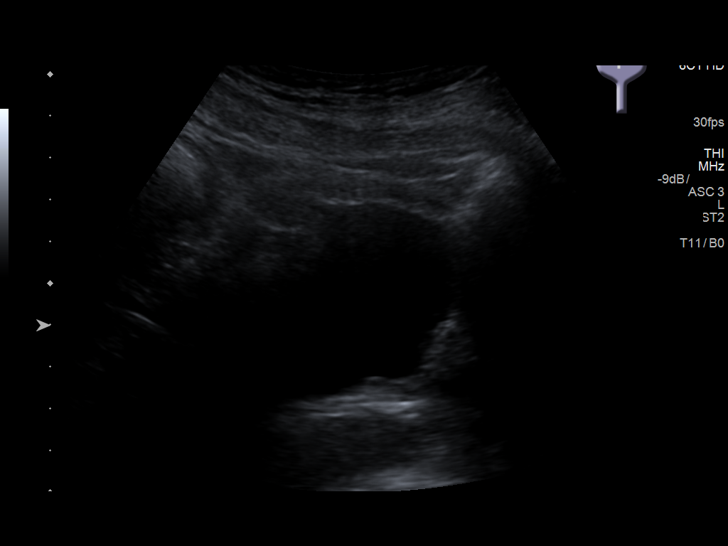
[im 27/27]
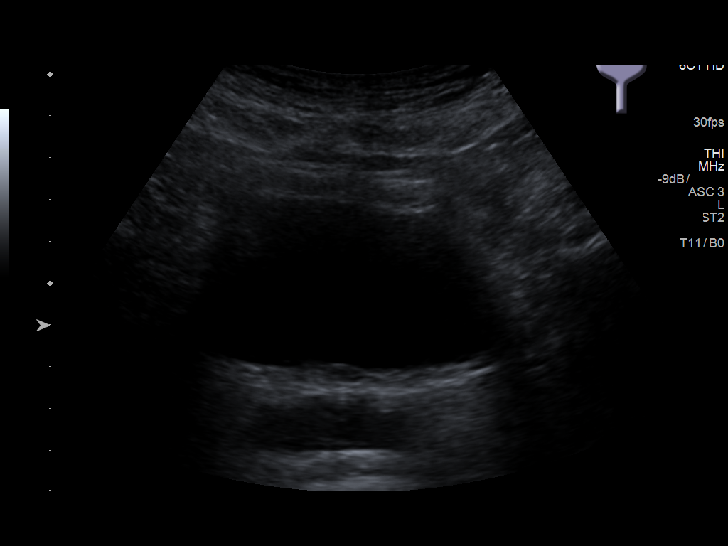

[14 of 25 positions shown; findings below may reference images not displayed]

FINDINGS: Right Kidney:

Renal measurements: 11.9 x 5.6 x 6.3 cm = volume: 220 mL .
Echogenicity within normal limits. No mass or hydronephrosis
visualized.

Left Kidney:

Renal measurements: 10.4 x 5.4 x 5.3 cm. = volume: 155 mL.
Echogenicity within normal limits. No mass or hydronephrosis
visualized.

Bladder:

Appears normal for degree of bladder distention.
IMPRESSION: No acute abnormality of the kidneys is noted.

## 2022-08-08 ENCOUNTER — Other Ambulatory Visit: Payer: Self-pay

## 2022-08-08 DIAGNOSIS — M5416 Radiculopathy, lumbar region: Secondary | ICD-10-CM

## 2022-08-08 DIAGNOSIS — M545 Low back pain, unspecified: Secondary | ICD-10-CM

## 2022-08-08 DIAGNOSIS — M48062 Spinal stenosis, lumbar region with neurogenic claudication: Secondary | ICD-10-CM

## 2022-08-11 ENCOUNTER — Other Ambulatory Visit: Payer: Self-pay | Admitting: Physician Assistant

## 2022-08-11 DIAGNOSIS — M48062 Spinal stenosis, lumbar region with neurogenic claudication: Secondary | ICD-10-CM

## 2022-08-11 DIAGNOSIS — M5416 Radiculopathy, lumbar region: Secondary | ICD-10-CM

## 2022-08-11 DIAGNOSIS — M545 Low back pain, unspecified: Secondary | ICD-10-CM

## 2022-08-19 ENCOUNTER — Ambulatory Visit
Admission: RE | Admit: 2022-08-19 | Discharge: 2022-08-19 | Disposition: A | Discharge status: Home / Self Care | Payer: 59 | Source: Ambulatory Visit | Attending: Physician Assistant | Admitting: Physician Assistant

## 2022-08-19 DIAGNOSIS — M545 Low back pain, unspecified: Secondary | ICD-10-CM

## 2022-08-19 DIAGNOSIS — M48062 Spinal stenosis, lumbar region with neurogenic claudication: Secondary | ICD-10-CM

## 2022-08-19 DIAGNOSIS — M5416 Radiculopathy, lumbar region: Secondary | ICD-10-CM

## 2023-07-18 ENCOUNTER — Emergency Department
Admission: EM | Admit: 2023-07-18 | Discharge: 2023-07-18 | Disposition: A | Discharge status: Home / Self Care | Attending: Emergency Medicine | Admitting: Emergency Medicine

## 2023-07-18 ENCOUNTER — Emergency Department

## 2023-07-18 ENCOUNTER — Other Ambulatory Visit: Payer: Self-pay

## 2023-07-18 ENCOUNTER — Encounter: Payer: Self-pay | Admitting: Emergency Medicine

## 2023-07-18 DIAGNOSIS — J40 Bronchitis, not specified as acute or chronic: Secondary | ICD-10-CM | POA: Diagnosis not present

## 2023-07-18 DIAGNOSIS — I10 Essential (primary) hypertension: Secondary | ICD-10-CM | POA: Diagnosis not present

## 2023-07-18 DIAGNOSIS — E119 Type 2 diabetes mellitus without complications: Secondary | ICD-10-CM | POA: Insufficient documentation

## 2023-07-18 DIAGNOSIS — R059 Cough, unspecified: Secondary | ICD-10-CM | POA: Diagnosis present

## 2023-07-18 DIAGNOSIS — R051 Acute cough: Secondary | ICD-10-CM

## 2023-07-18 LAB — BASIC METABOLIC PANEL
Anion gap: 4 — ABNORMAL LOW (ref 5–15)
BUN: 20 mg/dL (ref 8–23)
CO2: 25 mmol/L (ref 22–32)
Calcium: 9 mg/dL (ref 8.9–10.3)
Chloride: 105 mmol/L (ref 98–111)
Creatinine, Ser: 1.87 mg/dL — ABNORMAL HIGH (ref 0.61–1.24)
GFR, Estimated: 39 mL/min — ABNORMAL LOW (ref >60)
Glucose, Bld: 146 mg/dL — ABNORMAL HIGH (ref 70–99)
Potassium: 4.9 mmol/L (ref 3.5–5.1)
Sodium: 134 mmol/L — ABNORMAL LOW (ref 135–145)

## 2023-07-18 LAB — CBC
HCT: 48.8 % (ref 39.0–52.0)
Hemoglobin: 16.4 g/dL (ref 13.0–17.0)
MCH: 31.1 pg (ref 26.0–34.0)
MCHC: 33.6 g/dL (ref 30.0–36.0)
MCV: 92.4 fL (ref 80.0–100.0)
Platelets: 139 10*3/uL — ABNORMAL LOW (ref 150–400)
RBC: 5.28 MIL/uL (ref 4.22–5.81)
RDW: 15.2 % (ref 11.5–15.5)
WBC: 8.5 10*3/uL (ref 4.0–10.5)
nRBC: 0 % (ref 0.0–0.2)

## 2023-07-18 MED ORDER — GUAIFENESIN-CODEINE 100-10 MG/5ML PO SOLN
5.0000 mL | Freq: Four times a day (QID) | ORAL | 0 refills | Status: DC | PRN
Start: 2023-07-18 — End: 2023-07-18

## 2023-07-18 MED ORDER — PREDNISONE 10 MG PO TABS: 10.0000 mg | ORAL_TABLET | Freq: Every day | ORAL | 0 refills | Status: AC

## 2023-07-18 MED ORDER — GUAIFENESIN-CODEINE 100-10 MG/5ML PO SOLN: 5.0000 mL | Freq: Four times a day (QID) | ORAL | 0 refills | Status: AC | PRN

## 2023-07-18 NOTE — Discharge Instructions (Addendum)
 As we discussed please take your medications as prescribed.  Do not drink alcohol or drive while taking the cough medication.  You may also use over-the-counter pseudoephedrine that is available behind the counter at any pharmacy.  Return to the emergency department for any chest pain, trouble breathing, or any other symptom personally concerning to yourself.

## 2023-07-18 NOTE — ED Notes (Signed)
 Patient transported to X-ray

## 2023-07-18 NOTE — ED Provider Notes (Signed)
 [facility] [place] Provider Note    Event Date/Time   First MD Initiated Contact with Patient 07/18/23 1549     (approximate)  History   Chief Complaint: Cough  HPI  Jorge Olson is a 68 y.o. male with a past medical history of diabetes, hypertension, hyperlipidemia, presents to the emergency department for continued cough and shortness of breath.  According to the patient for the past 3 weeks or so he has been coughing states occasional shortness of breath, states persistent nasal congestion.  No fever.  Patient saw his doctor who prescribed him 5 days of antibiotics and steroids approximately 2 weeks ago.  Patient states he completed both of these but continued to have a cough so he came to the emergency department.  Patient has known COPD/emphysema follows up with pulmonology in [facility], has not been able to follow-up with them during this illness.  Physical Exam   Triage Vital Signs: ED Triage Vitals  Encounter Vitals Group     BP 07/18/23 1533 127/72     Systolic BP Percentile --      Diastolic BP Percentile --      Pulse Rate 07/18/23 1533 92     Resp 07/18/23 1533 17     Temp 07/18/23 1533 98.3 F (36.8 C)     Temp Source 07/18/23 1533 Oral     SpO2 07/18/23 1533 96 %     Weight 07/18/23 1525 207 lb (93.9 kg)     Height 07/18/23 1525 6' (1.829 m)     Head Circumference --      Peak Flow --      Pain Score 07/18/23 1525 0     Pain Loc --      Pain Education --      Exclude from Growth Chart --     Most recent vital signs: Vitals:   07/18/23 1533  BP: 127/72  Pulse: 92  Resp: 17  Temp: 98.3 F (36.8 C)  SpO2: 96%    General: Awake, no distress.  Mild rhinorrhea. CV:  Good peripheral perfusion.  Regular rate and rhythm  Resp:  Normal effort.  Equal breath sounds bilaterally.  Mild expiratory wheeze bilaterally. Abd:  No distention.  Soft, nontender.  No rebound or guarding.  ED Results / Procedures / Treatments   EKG  EKG  viewed and interpreted by myself shows a normal sinus rhythm at 91 bpm with a narrow QRS, normal axis, normal intervals, no concerning ST changes.  RADIOLOGY  I have reviewed and interpreted chest x-ray images.  No consolidation seen on my evaluation. Radiology has read the x-ray as peribronchial thickening related to chronic bronchitis or smoking.   MEDICATIONS ORDERED IN ED: Medications - No data to display   IMPRESSION / MDM / ASSESSMENT AND PLAN / ED COURSE  I reviewed the triage vital signs and the nursing notes.  Patient's presentation is most consistent with acute presentation with potential threat to life or bodily function.  Patient presents to the emergency department for continued cough x 3 weeks or so.  Overall the patient appears well, lab work is reassuring with a normal CBC with a normal white blood cell count, chemistry shows renal insufficiency however largely unchanged from baseline.  Patient's chest x-ray shows peribronchial thickening.  Highly suspect more of a chronic/acute on chronic bronchitis.  Discussed with the patient a prolonged prednisone taper approximately 12 days, cough medication as well as over-the-counter pseudoephedrine if needed for congestion.  Patient to follow-up with his doctor as well as pulmonologist.  Given the patient's reassuring workup reassuring physical exam I believe the patient safe for discharge home.  I did review the patient's past CT images including 1 from 3 months ago showing no masses or tumors did show a nodule that they have been watching and will continue to watch.  FINAL CLINICAL IMPRESSION(S) / ED DIAGNOSES   Bronchitis    Note:  This document was prepared using Dragon voice recognition software and may include unintentional dictation errors.   Minna Antis, MD 07/18/23 [phone]

## 2023-07-18 NOTE — ED Triage Notes (Signed)
 Pt in via POV, reports ongoing cough and shortness of breath that he cannot get rid of this cough.  Reports finishing Zpack, and steroid therapy w/out any relief.  NAD noted at this time.
